# Patient Record
Sex: Male | Born: 1995
Health system: Southern US, Community
[De-identification: ages and names within clinical notes are randomized; demographics above are authoritative.]

## PROBLEM LIST (undated history)

## (undated) DIAGNOSIS — Z789 Other specified health status: Secondary | ICD-10-CM

## (undated) DIAGNOSIS — N451 Epididymitis: Secondary | ICD-10-CM

---
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
## 2017-10-30 ENCOUNTER — Other Ambulatory Visit: Payer: Self-pay

## 2017-10-30 ENCOUNTER — Ambulatory Visit
Admission: EM | Admit: 2017-10-30 | Discharge: 2017-10-30 | Disposition: A | Discharge status: Home / Self Care | Payer: Self-pay | Attending: Family Medicine | Admitting: Family Medicine

## 2017-10-30 ENCOUNTER — Encounter: Payer: Self-pay | Admitting: Emergency Medicine

## 2017-10-30 DIAGNOSIS — K921 Melena: Secondary | ICD-10-CM

## 2017-10-30 LAB — CBC WITH DIFFERENTIAL/PLATELET
Abs Immature Granulocytes: 0.02 10*3/uL (ref 0.00–0.07)
Basophils Absolute: 0.1 10*3/uL (ref 0.0–0.1)
Basophils Relative: 1 %
EOS ABS: 0.2 10*3/uL (ref 0.0–0.5)
EOS PCT: 4 %
HCT: 46.5 % (ref 39.0–52.0)
HEMOGLOBIN: 15.9 g/dL (ref 13.0–17.0)
Immature Granulocytes: 0 %
LYMPHS ABS: 2 10*3/uL (ref 0.7–4.0)
LYMPHS PCT: 37 %
MCH: 30.9 pg (ref 26.0–34.0)
MCHC: 34.2 g/dL (ref 30.0–36.0)
MCV: 90.3 fL (ref 80.0–100.0)
MONO ABS: 0.3 10*3/uL (ref 0.1–1.0)
MONOS PCT: 6 %
Neutro Abs: 2.8 10*3/uL (ref 1.7–7.7)
Neutrophils Relative %: 52 %
Platelets: 242 10*3/uL (ref 150–400)
RBC: 5.15 MIL/uL (ref 4.22–5.81)
RDW: 12.5 % (ref 11.5–15.5)
WBC: 5.3 10*3/uL (ref 4.0–10.5)
nRBC: 0 % (ref 0.0–0.2)

## 2017-10-30 LAB — OCCULT BLOOD X 1 CARD TO LAB, STOOL: Fecal Occult Bld: NEGATIVE

## 2017-10-30 NOTE — Discharge Instructions (Addendum)
Continue to monitor and monitor for trigger.   You need to follow up with Gastroenterology. See above to call today to schedule.   Return to Urgent care as needed.

## 2017-10-30 NOTE — ED Triage Notes (Signed)
Patient c/o intermittent blood in his stools that started over 1 year ago. Denies any pain.

## 2017-10-30 NOTE — ED Provider Notes (Signed)
[name] ____________________________________________  Time seen: Approximately 11:06 AM  I have reviewed the triage vital signs and the nursing notes.   HISTORY  Chief Complaint Blood In Stools  HPI Russell Atkinson is a 22 y.o. male presenting for evaluation of bloody stools that have been intermittent for at least 1 year.  States he usually noticed this and it will prove present for 2 to 3 days and then fully resolve.  Varies for frequency from days to weeks in between.  States he is beginning to question if he has a lactose intolerance, as he believes he noticed this after having a lot of milk.  States 3 days ago he had a lot of milk and cereal, and the next day he had diarrhea when he noticed some blood in the stool and in the toilet that was bright red.  States the diarrhea stool appeared brown and not black.  Some blood present on the tissue with wiping.  States this stopped yesterday, and he did have a bowel movement this morning that was described as normal without any abnormal colored stool or blood noted.  Denies any accompanying abdominal pain, vomiting, constipation, straining, trauma, increase physical activity.  Denies any other abnormal bleeding.  Continues to eat and drink well.  Has had some weight gain in the last few years, no acute weight loss.  Continues to eat normally.  Denies any other aggravating alleviating factors.  States overall he feels well.  Not had this previously evaluated.    History reviewed. No pertinent past medical history. Denies   There are no active problems to display for this patient.   History reviewed. No pertinent surgical history.   No current facility-administered medications for this encounter.  No current outpatient medications on file.  Allergies Patient has no known allergies.   family history Mother: healthy Father: healthy   Denies family history GI issues.   Social History Social History    Tobacco Use  . Smoking status: Never Smoker  . Smokeless tobacco: Never Used  Substance Use Topics  . Alcohol use: Yes  . Drug use: Yes    Types: Marijuana    Comment: yesterday    Review of Systems Constitutional: No fever/chills Cardiovascular: Denies chest pain. Respiratory: Denies shortness of breath. Gastrointestinal: No abdominal pain.  No nausea, no vomiting.  As above.  Genitourinary: Negative for dysuria.  Denies penile or testicular pain or discomfort. Musculoskeletal: Negative for back pain. Skin: Negative for rash.   ____________________________________________   PHYSICAL EXAM:  VITAL SIGNS: ED Triage Vitals  Enc Vitals Group     BP 10/30/17 0950 136/71     Pulse Rate 10/30/17 0950 (!) 57     Resp 10/30/17 0950 18     Temp 10/30/17 0950 98.5 F (36.9 C)     Temp Source 10/30/17 0950 Oral     SpO2 10/30/17 0950 97 %     Weight 10/30/17 0947 218 lb (98.9 kg)     Height 10/30/17 0947 5\' 10"  (1.778 m)     Head Circumference --      Peak Flow --      Pain Score 10/30/17 0947 0     Pain Loc --      Pain Edu? --      Excl. in GC? --     Constitutional: Alert and oriented. Well appearing and in no acute distress. ENT      Head: Normocephalic and atraumatic. Cardiovascular: Normal rate, regular rhythm. Grossly  normal heart sounds.  Good peripheral circulation. Respiratory: Normal respiratory effort without tachypnea nor retractions. Breath sounds are clear and equal bilaterally. No wheezes, rales, rhonchi. Gastrointestinal: Soft and nontender. No distention. Normal Bowel sounds. No CVA tenderness. Rectal: Patient declined chaperone. External: No external mass, bulge or hemorrhoid noted.  Digital exam mildly tender, no hemorrhoid palpated, no gross blood, brown stool. Musculoskeletal:  Steady gait.  Neurologic:  Normal speech and language. Speech is normal. No gait instability.  Skin:  Skin is warm, dry and intact. No rash noted. Psychiatric: Mood and  affect are normal. Speech and behavior are normal. Patient exhibits appropriate insight and judgment   ___________________________________________   LABS (all labs ordered are listed, but only abnormal results are displayed)  Labs Reviewed  CBC WITH DIFFERENTIAL/PLATELET  OCCULT BLOOD X 1 CARD TO LAB, STOOL   ____________________________________________ PROCEDURES Procedures    INITIAL IMPRESSION / ASSESSMENT AND PLAN / ED COURSE  Pertinent labs & imaging results that were available during my care of the patient were reviewed by me and considered in my medical decision making (see chart for details).  Well-appearing patient.  No acute distress.  Report of intermittent rectal bleeding and blood in stool for the last 1 year.  Denies current symptoms.  Denies any pain or current discomfort.  CBC unremarkable.  Hemoccult exam negative for blood at this time.  Patient does report concern of possible food trigger, counseled regarding regarding monitoring for triggers.  Encourage patient to have follow-up with GI in the next 1 weeks, information given and directed patient to call today and schedule.  Patient agrees with this plan.  Also encouraged to establish primary care.  Discussed follow up and return parameters including no resolution or any worsening concerns. Patient verbalized understanding and agreed to plan.   ____________________________________________   FINAL CLINICAL IMPRESSION(S) / ED DIAGNOSES  Final diagnoses:  Blood in stool     ED Discharge Orders    None       Note: This dictation was prepared with Dragon dictation along with smaller phrase technology. Any transcriptional errors that result from this process are unintentional.         Renford Dills, NP 10/30/17 1401

## 2018-06-24 ENCOUNTER — Encounter: Payer: Self-pay | Admitting: Emergency Medicine

## 2018-06-24 ENCOUNTER — Other Ambulatory Visit: Payer: Self-pay

## 2018-06-24 ENCOUNTER — Ambulatory Visit
Admission: EM | Admit: 2018-06-24 | Discharge: 2018-06-24 | Disposition: A | Discharge status: Home / Self Care | Payer: Self-pay | Attending: Family Medicine | Admitting: Family Medicine

## 2018-06-24 DIAGNOSIS — L255 Unspecified contact dermatitis due to plants, except food: Secondary | ICD-10-CM

## 2018-06-24 MED ORDER — CLOBETASOL PROPIONATE 0.05 % EX CREA: 1.0000 "application " | TOPICAL_CREAM | Freq: Two times a day (BID) | CUTANEOUS | 0 refills | Status: DC

## 2018-06-24 NOTE — ED Provider Notes (Signed)
[name]    CSN: [id number] Arrival date & time: 06/24/18  1657  History   Chief Complaint Chief Complaint  Patient presents with  . Rash    HPI   23 year old male presents with rash.  Patient reports a 3-day history of itchy rash.  He has an area located on the posterior aspect of his neck.  He also has a few areas located on his upper extremities.  No medications or interventions tried.  Patient states that the appearance seems to be consistent with poison oak or poison ivy.  No known exacerbating factors.  He is unsure of his exposure.  No other associated symptoms.  No other complaints.  Hx reviewed and updated as below. PMH: Hx of rectal bleeding.  Home Medications    Prior to Admission medications   Medication Sig Start Date End Date Taking? Authorizing Provider  clobetasol cream (TEMOVATE) 0.05 % Apply 1 application topically 2 (two) times daily. 06/24/18   Tommie Samsook, Emerick Weatherly G, DO   Social History Social History   Tobacco Use  . Smoking status: Never Smoker  . Smokeless tobacco: Never Used  Substance Use Topics  . Alcohol use: Yes  . Drug use: Yes    Types: Marijuana    Comment: yesterday     Allergies   Patient has no known allergies.   Review of Systems Review of Systems  Constitutional: Negative.   Skin: Positive for rash.   Physical Exam Triage Vital Signs ED Triage Vitals [06/24/18 1713]  Enc Vitals Group     BP 130/66     Pulse Rate 67     Resp 18     Temp 98.3 F (36.8 C)     Temp Source Oral     SpO2 100 %     Weight 195 lb (88.5 kg)     Height 5\' 10"  (1.778 m)     Head Circumference      Peak Flow      Pain Score 0     Pain Loc      Pain Edu?      Excl. in GC?    Updated Vital Signs BP 130/66 (BP Location: Right Arm)   Pulse 67   Temp 98.3 F (36.8 C) (Oral)   Resp 18   Ht 5\' 10"  (1.778 m)   Wt 88.5 kg   SpO2 100%   BMI 27.98 kg/m   Visual Acuity Right Eye Distance:   Left Eye Distance:   Bilateral Distance:     Right Eye Near:   Left Eye Near:    Bilateral Near:     Physical Exam Vitals signs and nursing note reviewed.  Constitutional:      General: He is not in acute distress.    Appearance: Normal appearance.  HENT:     Head: Normocephalic and atraumatic.  Eyes:     General:        Right eye: No discharge.        Left eye: No discharge.     Conjunctiva/sclera: Conjunctivae normal.  Neck:      Comments: Erythematous, vesicular rash noted at the labelled location. Pulmonary:     Effort: Pulmonary effort is normal. No respiratory distress.  Skin:    Comments: Arms with a few scattered erythematous papules.  Neurological:     Mental Status: He is alert.  Psychiatric:        Mood and Affect: Mood normal.  Behavior: Behavior normal.    UC Treatments / Results  Labs (all labs ordered are listed, but only abnormal results are displayed) Labs Reviewed - No data to display  EKG None  Radiology No results found.  Procedures Procedures (including critical care time)  Medications Ordered in UC Medications - No data to display  Initial Impression / Assessment and Plan / UC Course  I have reviewed the triage vital signs and the nursing notes.  Pertinent labs & imaging results that were available during my care of the patient were reviewed by me and considered in my medical decision making (see chart for details).    23 year old male presents with rash that is likely secondary to poison oak/ivy/sumac. Treating with clobetasol.  Final Clinical Impressions(s) / UC Diagnoses   Final diagnoses:  Dermatitis due to plants, including poison ivy, sumac, and oak     Discharge Instructions     Medication as prescribed.  Take care  Dr. Lacinda Axon    ED Prescriptions    Medication Sig Dispense Auth. Provider   clobetasol cream (TEMOVATE) 1.70 % Apply 1 application topically 2 (two) times daily. 60 g Coral Spikes, DO     Controlled Substance Prescriptions [place]  Controlled Substance Registry consulted? Not Applicable   Coral Spikes, DO 06/24/18 1753

## 2018-06-24 NOTE — ED Triage Notes (Signed)
Patient c/o itchy rash that started 3 days ago. He thinks he may have come in contact with poison ivy. He reports it on the back of his neck and bilateral arms.

## 2018-06-24 NOTE — Discharge Instructions (Signed)
Medication as prescribed.  Take care  Dr. Tishana Clinkenbeard  

## 2018-09-02 ENCOUNTER — Other Ambulatory Visit: Payer: Self-pay

## 2018-09-02 DIAGNOSIS — Z20822 Contact with and (suspected) exposure to covid-19: Secondary | ICD-10-CM

## 2018-09-03 LAB — NOVEL CORONAVIRUS, NAA: SARS-CoV-2, NAA: NOT DETECTED

## 2019-03-22 ENCOUNTER — Other Ambulatory Visit: Payer: Self-pay

## 2019-03-22 ENCOUNTER — Emergency Department
Admission: EM | Admit: 2019-03-22 | Discharge: 2019-03-22 | Disposition: A | Discharge status: Home / Self Care | Payer: Self-pay | Attending: Emergency Medicine | Admitting: Emergency Medicine

## 2019-03-22 DIAGNOSIS — Y929 Unspecified place or not applicable: Secondary | ICD-10-CM | POA: Insufficient documentation

## 2019-03-22 DIAGNOSIS — S3121XA Laceration without foreign body of penis, initial encounter: Secondary | ICD-10-CM | POA: Insufficient documentation

## 2019-03-22 DIAGNOSIS — Z79899 Other long term (current) drug therapy: Secondary | ICD-10-CM | POA: Insufficient documentation

## 2019-03-22 DIAGNOSIS — Y9389 Activity, other specified: Secondary | ICD-10-CM | POA: Insufficient documentation

## 2019-03-22 DIAGNOSIS — X58XXXA Exposure to other specified factors, initial encounter: Secondary | ICD-10-CM | POA: Insufficient documentation

## 2019-03-22 DIAGNOSIS — Y999 Unspecified external cause status: Secondary | ICD-10-CM | POA: Insufficient documentation

## 2019-03-22 DIAGNOSIS — F17200 Nicotine dependence, unspecified, uncomplicated: Secondary | ICD-10-CM | POA: Insufficient documentation

## 2019-03-22 NOTE — ED Provider Notes (Signed)
-----------------------------------------   9:14 PM on 03/22/2019 -----------------------------------------  I have personally seen and evaluated the patient.  Patient has a small tear to the frenulum attached to the foreskin.  No urethral injury identified.  No penile tenderness besides the injured frenulum.  Patient will be discharged home discussed wound care with Neosporin and avoiding intercourse for the next 10 days.   Minna Antis, MD 03/22/19 2114

## 2019-03-22 NOTE — ED Notes (Signed)
This RN went into pt's treatment room to attain d/c vitals and go over d/c instructions. Pt not found in treatment room or in nearby bathrooms. Provider Annice Pih made aware.

## 2019-03-22 NOTE — ED Triage Notes (Signed)
Pt states he was having intercourse and he felt a tear and noticed penile bleeding from the tip. Pt states he called 911 but when the ambulance arrived the bleeding had stopped.

## 2019-03-22 NOTE — ED Provider Notes (Signed)
Emergency Department Provider Note  ____________________________________________  Time seen: Approximately 8:59 PM  I have reviewed the triage vital signs and the nursing notes.   HISTORY  Chief Complaint Penile Discharge   Historian Patient     HPI Russell Atkinson is a 24 y.o. male presents to the emergency department with penile pain and bleeding from the urethral meatus.  Patient states that he was engaging in intercourse with his significant other when he felt a sharp pain and experienced bleeding.  He denies a history of penile issues in the past.  No recent dysuria, hematuria or increased urinary frequency.   History reviewed. No pertinent past medical history.   Immunizations up to date:  Yes.     History reviewed. No pertinent past medical history.  There are no problems to display for this patient.   History reviewed. No pertinent surgical history.  Prior to Admission medications   Medication Sig Start Date End Date Taking? Authorizing Provider  clobetasol cream (TEMOVATE) 0.05 % Apply 1 application topically 2 (two) times daily. 06/24/18   Russell Sams, DO    Allergies Patient has no known allergies.  History reviewed. No pertinent family history.  Social History Social History   Tobacco Use  . Smoking status: Current Every Day Smoker  . Smokeless tobacco: Never Used  Substance Use Topics  . Alcohol use: Yes  . Drug use: Yes    Types: Marijuana    Comment: yesterday     Review of Systems  Constitutional: No fever/chills Eyes:  No discharge ENT: No upper respiratory complaints. Respiratory: no cough. No SOB/ use of accessory muscles to breath Gastrointestinal:   No nausea, no vomiting.  No diarrhea.  No constipation. Musculoskeletal: Negative for musculoskeletal pain. Skin: Patient has bleeding from frenulum of penis.  ____________________________________________   PHYSICAL EXAM:  VITAL SIGNS: ED Triage Vitals  [03/22/19 1806]  Enc Vitals Group     BP (!) 136/94     Pulse Rate 73     Resp 18     Temp 98.5 F (36.9 C)     Temp Source Oral     SpO2 98 %     Weight 200 lb (90.7 kg)     Height 5\' 10"  (1.778 m)     Head Circumference      Peak Flow      Pain Score 0     Pain Loc      Pain Edu?      Excl. in GC?      Constitutional: Alert and oriented. Well appearing and in no acute distress. Eyes: Conjunctivae are normal. PERRL. EOMI. Head: Atraumatic. Cardiovascular: Normal rate, regular rhythm. Normal S1 and S2.  Good peripheral circulation. Respiratory: Normal respiratory effort without tachypnea or retractions. Lungs CTAB. Good air entry to the bases with no decreased or absent breath sounds Gastrointestinal: Bowel sounds x 4 quadrants. Soft and nontender to palpation. No guarding or rigidity. No distention. Musculoskeletal: Full range of motion to all extremities. No obvious deformities noted Neurologic:  Normal for age. No gross focal neurologic deficits are appreciated.  Skin: Patient is uncircumcised with bleeding at frenulum of penis. Psychiatric: Mood and affect are normal for age. Speech and behavior are normal.   ____________________________________________   LABS (all labs ordered are listed, but only abnormal results are displayed)  Labs Reviewed - No data to display ____________________________________________  EKG   ____________________________________________  RADIOLOGY   No results found.  ____________________________________________    PROCEDURES  Procedure(s) performed:     Procedures     Medications - No data to display   ____________________________________________   INITIAL IMPRESSION / ASSESSMENT AND PLAN / ED COURSE  Pertinent labs & imaging results that were available during my care of the patient were reviewed by me and considered in my medical decision making (see chart for details).      Assessment and plan Frenulum  tear 24 year old male presents to the emergency department with a small tear of frenulum of penis that occurred while patient was engaged in intercourse.  Attending Dr. Kerman Passey was consulted regarding patient's case and we both have low suspicion for urethral injury.  Patient education regarding use of topical antibiotics and wound care was given.  Patient was advised to abstain from sex for the next 7 to 10 days.  He was advised to follow-up with primary care as needed.  All patient questions were answered.  ____________________________________________  FINAL CLINICAL IMPRESSION(S) / ED DIAGNOSES  Final diagnoses:  Laceration of penis, initial encounter      NEW MEDICATIONS STARTED DURING THIS VISIT:  ED Discharge Orders    None          This chart was dictated using voice recognition software/Dragon. Despite best efforts to proofread, errors can occur which can change the meaning. Any change was purely unintentional.     Russell Atkinson 03/25/19 2036    Russell Dark, MD 03/27/19 2234

## 2019-04-15 ENCOUNTER — Ambulatory Visit
Admission: EM | Admit: 2019-04-15 | Discharge: 2019-04-15 | Disposition: A | Discharge status: Home / Self Care | Payer: 59 | Attending: Family Medicine | Admitting: Family Medicine

## 2019-04-15 ENCOUNTER — Other Ambulatory Visit: Payer: Self-pay

## 2019-04-15 ENCOUNTER — Encounter: Payer: Self-pay | Admitting: Emergency Medicine

## 2019-04-15 DIAGNOSIS — Z8619 Personal history of other infectious and parasitic diseases: Secondary | ICD-10-CM

## 2019-04-15 DIAGNOSIS — A549 Gonococcal infection, unspecified: Secondary | ICD-10-CM | POA: Diagnosis not present

## 2019-04-15 DIAGNOSIS — R300 Dysuria: Secondary | ICD-10-CM

## 2019-04-15 HISTORY — DX: Personal history of other infectious and parasitic diseases: Z86.19

## 2019-04-15 HISTORY — DX: Other specified health status: Z78.9

## 2019-04-15 LAB — URINALYSIS, COMPLETE (UACMP) WITH MICROSCOPIC
Bacteria, UA: NONE SEEN
Bilirubin Urine: NEGATIVE
Glucose, UA: NEGATIVE mg/dL
Hgb urine dipstick: NEGATIVE
Ketones, ur: NEGATIVE mg/dL
Nitrite: NEGATIVE
Protein, ur: NEGATIVE mg/dL
RBC / HPF: NONE SEEN RBC/hpf (ref 0–5)
Specific Gravity, Urine: 1.02 (ref 1.005–1.030)
Squamous Epithelial / HPF: NONE SEEN (ref 0–5)
pH: 7 (ref 5.0–8.0)

## 2019-04-15 LAB — CHLAMYDIA/NGC RT PCR (ARMC ONLY)
Chlamydia Tr: NOT DETECTED
N gonorrhoeae: DETECTED — AB

## 2019-04-15 MED ORDER — CEFTRIAXONE SODIUM 500 MG IJ SOLR
500.0000 mg | Freq: Once | INTRAMUSCULAR | Status: AC
  Administered 2019-04-15: 500 mg via INTRAMUSCULAR

## 2019-04-15 MED ORDER — DOXYCYCLINE HYCLATE 100 MG PO CAPS: 100.0000 mg | ORAL_CAPSULE | Freq: Two times a day (BID) | ORAL | 0 refills | Status: DC

## 2019-04-15 NOTE — ED Provider Notes (Signed)
[name]    CSN: [id number] Arrival date & time: 04/15/19  1141      History   Chief Complaint Chief Complaint  Patient presents with  . Dysuria    HPI  24 year old male presents with dysuria and penile discharge.  Patient reports his symptoms started yesterday.  He reports that he developed dysuria yesterday.  Describes it as burning when he urinates.  Reports that he had penile discharge this morning.  Patient concerned for STD.  Patient has recently engaged in a 3 some with another male and his current male partner.  Reports that his pain is currently 3/10 in severity.  No other associated symptoms.  No other complaints.   Home Medications    Prior to Admission medications   Medication Sig Start Date End Date Taking? Authorizing Provider  doxycycline (VIBRAMYCIN) 100 MG capsule Take 1 capsule (100 mg total) by mouth 2 (two) times daily. 04/15/19   Tommie Sams, DO   Social History Social History   Tobacco Use  . Smoking status: Current Every Day Smoker  . Smokeless tobacco: Never Used  Substance Use Topics  . Alcohol use: Yes  . Drug use: Yes    Types: Marijuana    Comment: yesterday     Allergies   Patient has no known allergies.   Review of Systems Review of Systems  Constitutional: Negative.   Genitourinary: Positive for discharge and dysuria.   Physical Exam Triage Vital Signs ED Triage Vitals  Enc Vitals Group     BP 04/15/19 1211 140/69     Pulse Rate 04/15/19 1211 71     Resp 04/15/19 1211 18     Temp 04/15/19 1211 98.2 F (36.8 C)     Temp Source 04/15/19 1211 Oral     SpO2 04/15/19 1211 100 %     Weight 04/15/19 1209 215 lb (97.5 kg)     Height 04/15/19 1209 5\' 10"  (1.778 m)     Head Circumference --      Peak Flow --      Pain Score 04/15/19 1208 3     Pain Loc --      Pain Edu? --      Excl. in GC? --    Updated Vital Signs BP 140/69 (BP Location: Right Arm)   Pulse 71   Temp 98.2 F (36.8 C) (Oral)   Resp  18   Ht 5\' 10"  (1.778 m)   Wt 97.5 kg   SpO2 100%   BMI 30.85 kg/m   Visual Acuity Right Eye Distance:   Left Eye Distance:   Bilateral Distance:    Right Eye Near:   Left Eye Near:    Bilateral Near:     Physical Exam Vitals and nursing note reviewed.  Constitutional:      General: He is not in acute distress.    Appearance: Normal appearance. He is not ill-appearing.  HENT:     Head: Normocephalic and atraumatic.  Eyes:     General:        Right eye: No discharge.        Left eye: No discharge.     Conjunctiva/sclera: Conjunctivae normal.  Pulmonary:     Effort: Pulmonary effort is normal. No respiratory distress.  Genitourinary:    Comments: Uncircumcised penis.  Foreskin easily retracted.  No apparent penile discharge at this time. Neurological:     Mental Status: He is alert.  Psychiatric:  Mood and Affect: Mood normal.        Behavior: Behavior normal.    UC Treatments / Results  Labs (all labs ordered are listed, but only abnormal results are displayed) Labs Reviewed  CHLAMYDIA/NGC RT PCR ([facility] ONLY) - Abnormal; Notable for the following components:      Result Value   N gonorrhoeae DETECTED (*)    All other components within normal limits  URINALYSIS, COMPLETE (UACMP) WITH MICROSCOPIC - Abnormal; Notable for the following components:   Leukocytes,Ua TRACE (*)    All other components within normal limits    EKG   Radiology No results found.  Procedures Procedures (including critical care time)  Medications Ordered in UC Medications  cefTRIAXone (ROCEPHIN) injection 500 mg (500 mg Intramuscular Given 04/15/19 1234)    Initial Impression / Assessment and Plan / UC Course  I have reviewed the triage vital signs and the nursing notes.  Pertinent labs & imaging results that were available during my care of the patient were reviewed by me and considered in my medical decision making (see chart for details).    24 year old male  presents with dysuria and penile discharge.  Test result revealed gonorrhea.  Patient was given empiric Rocephin and doxycycline.  Final Clinical Impressions(s) / UC Diagnoses   Final diagnoses:  Gonorrhea     Discharge Instructions     Medication as prescribed. Take with food. Avoid the sun.  Results should be available tomorrow.  Take care  Dr. Lacinda Axon     ED Prescriptions    Medication Sig Dispense Auth. Provider   doxycycline (VIBRAMYCIN) 100 MG capsule Take 1 capsule (100 mg total) by mouth 2 (two) times daily. 14 capsule Thersa Salt G, DO     PDMP not reviewed this encounter.   Coral Spikes, [place] 04/15/19 1922

## 2019-04-15 NOTE — Discharge Instructions (Signed)
Medication as prescribed. Take with food. Avoid the sun.  Results should be available tomorrow.  Take care  Dr. Adriana Simas

## 2019-04-15 NOTE — ED Triage Notes (Signed)
Patient c/o penile pain that started hurting yesterday. He does c/o dysuria that started yesterday. He is also concerned for STDs.

## 2019-04-16 ENCOUNTER — Encounter: Payer: Self-pay | Admitting: Emergency Medicine

## 2019-04-16 ENCOUNTER — Ambulatory Visit (INDEPENDENT_AMBULATORY_CARE_PROVIDER_SITE_OTHER): Payer: 59

## 2019-04-16 ENCOUNTER — Ambulatory Visit
Admission: EM | Admit: 2019-04-16 | Discharge: 2019-04-16 | Disposition: A | Discharge status: Home / Self Care | Payer: 59 | Attending: Urgent Care | Admitting: Urgent Care

## 2019-04-16 ENCOUNTER — Other Ambulatory Visit: Payer: Self-pay

## 2019-04-16 DIAGNOSIS — N50811 Right testicular pain: Secondary | ICD-10-CM

## 2019-04-16 DIAGNOSIS — A549 Gonococcal infection, unspecified: Secondary | ICD-10-CM

## 2019-04-16 DIAGNOSIS — N50819 Testicular pain, unspecified: Secondary | ICD-10-CM

## 2019-04-16 HISTORY — DX: Epididymitis: N45.1

## 2019-04-16 MED ORDER — KETOROLAC TROMETHAMINE 60 MG/2ML IM SOLN
60.0000 mg | Freq: Once | INTRAMUSCULAR | Status: AC
  Administered 2019-04-16: 12:00:00 60 mg via INTRAMUSCULAR

## 2019-04-16 NOTE — Discharge Instructions (Addendum)
It was very nice seeing you today in clinic. Thank you for entrusting me with your care.   [place] was NEGATIVE for torsion and epididymitis.   Make sure you have informed all sexual partners so that they may be tested and treated as needed. Abstain from sexual activity for the next 7-10 days.   You indicated that you did not want anything stronger for pain. Recommend Tylenol 500 mg and Ibuprofen 400 mg taken TOGETHER for pain.   Make arrangements to follow up with your regular doctor in 1 week for re-evaluation if not improving. If your symptoms/condition worsens, please seek follow up care either here or in the ER. Please remember, our [facility] providers are "right here with you" when you need [place].   Again, it was my pleasure to take care of you today. Thank you for choosing our clinic. I hope that you start to feel better quickly.   Quentin Mulling, MSN, APRN, FNP-C, CEN Advanced Practice Provider [name]

## 2019-04-16 NOTE — ED Triage Notes (Signed)
Patient c/o right testicle pain that started this morning. Patient has a history of epididymitis.

## 2019-04-16 NOTE — ED Provider Notes (Signed)
Russell Atkinson, Russell Atkinson   Name: Russell Atkinson DOB: 1995/10/20 MRN: [id number] CSN: [id number] PCP: Patient, No Pcp Per  Arrival date and time:  04/16/19 1055  Chief Complaint:  Testicle Pain  NOTE: Prior to seeing the patient today, I have reviewed the triage nursing documentation and vital signs. Clinical staff has updated patient's PMH/PSHx, current medication list, and drug allergies/intolerances to ensure comprehensive history available to assist in medical decision making.   History:   HPI: Russell Atkinson is a 24 y.o. male who presents today with complaints of pain and swelling in his RIGHT testicle that started with acute onset earlier this morning. Patient was seen here yesterday by Russell Atkinson for complaints of dysuria and penile discharge following participation in a multi-partner sexual encounter. STI testing resulted (+) for gonorrhea. He was empirically treated in clinic with ceftriaxone 500 mg IM and sent home on a 7 day course of oral doxycycline. PMH (+) for epididymitis per his report. Patient states, "this feels the same as it did last time. I went to the doctor and they told me it was making my testicle twist and I could have lost it. I want to make sure that it not what is going on this time to be safe". Patient continues with the dysuria and penile discharge. Patient denies fever, chills, or other systemic signs of infection; no hypotension, tachycardia, nausea, vomiting, or vertiginous symptoms. Patient is not experiencing any abdominal pain. Despite his symptoms, patient has not taken any over the counter interventions to help improve/relieve his reported symptoms at home.   Past Medical History:  Diagnosis Date  . Epididymitis   . History of gonorrhea 04/15/2019  . Uncircumcised male     History reviewed. No pertinent surgical history.  History reviewed. No pertinent family history.  Social History   Tobacco Use  . Smoking status: Current Every Day  Smoker  . Smokeless tobacco: Never Used  Substance Use Topics  . Alcohol use: Yes  . Drug use: Yes    Types: Marijuana    Comment: yesterday    There are no problems to display for this patient.   Home Medications:    Current Meds  Medication Sig  . doxycycline (VIBRAMYCIN) 100 MG capsule Take 1 capsule (100 mg total) by mouth 2 (two) times daily.    Allergies:   Patient has no known allergies.  Review of Systems (ROS):  Review of systems NEGATIVE unless otherwise noted in narrative H&P section.   Vital Signs: Today's Vitals   04/16/19 1110 04/16/19 1111 04/16/19 1112 04/16/19 1221  BP:   120/80   Pulse:   64   Resp:   18   Temp:   98.2 F (36.8 C)   TempSrc:   Oral   SpO2:   98%   Weight:  214 lb 15.2 oz (97.5 kg)    Height:  5\' 10"  (1.778 m)    PainSc: 8    8     Physical Exam: Physical Exam  Constitutional: He is oriented to person, place, and time and well-developed, well-nourished, and in no distress.  HENT:  Head: Normocephalic and atraumatic.  Eyes: Pupils are equal, round, and reactive to light.  Cardiovascular: Normal rate and intact distal pulses.  Pulmonary/Chest: Effort normal. No respiratory distress.  Abdominal: Soft. Normal appearance and bowel sounds are normal. He exhibits no distension. There is no abdominal tenderness. There is no CVA tenderness.  Genitourinary: He exhibits testicular tenderness (RIGHT) and scrotal tenderness. He exhibits  no epididymal tenderness. Cremasteric reflex is present. Penis exhibits no lesions and no edema. No discharge found.    Genitourinary Comments: Phren's (-). Tested (+) for gonorrhea on 04/15/2019.   Neurological: He is alert and oriented to person, place, and time. Gait normal.  Skin: Skin is warm and dry. No rash noted. He is not diaphoretic.  Psychiatric: Mood, memory, affect and judgment normal.  Nursing note and vitals reviewed.   Urgent Care Treatments / Results:   Orders Placed This Encounter   Procedures  . US SCROTUM W/DOPPLER    LABS: PLEASE NOTE: all labs that were ordered this encounter are listed, however only abnormal results are displayed. Labs Reviewed - No data to display  EKG: -None  RADIOLOGY: US SCROTUM W/DOPPLER  Result Date: 04/16/2019 CLINICAL DATA:  Right greater than left testicular pain since this morning. EXAM: SCROTAL ULTRASOUND DOPPLER ULTRASOUND OF THE TESTICLES TECHNIQUE: Complete ultrasound examination of the testicles, epididymis, and other scrotal structures was performed. Color and spectral Doppler ultrasound were also utilized to evaluate blood flow to the testicles. COMPARISON:  None. FINDINGS: Right testicle Measurements: 4.3 x 2.6 x 3.1 cm. No mass or microlithiasis visualized. Left testicle Measurements: 3.7 x 2.4 x 3.1 cm. No mass or microlithiasis visualized. Right epididymis:  Normal in size and appearance. Left epididymis:  Normal in size and appearance. Hydrocele:  Tiny amount right scrotal fluid. Varicocele:  None visualized. Pulsed Doppler interrogation of both testes demonstrates normal low resistance arterial and venous waveforms bilaterally. IMPRESSION: 1. Normal symmetric testicles with normal symmetric vascularity. No acute findings and no evidence of torsion. Electronically Signed   By: Russell Atkinson M.D.   On: 04/16/2019 12:14    PROCEDURES: Procedures  MEDICATIONS RECEIVED THIS VISIT: Medications  ketorolac (TORADOL) injection 60 mg (60 mg Intramuscular Given 04/16/19 1205)    PERTINENT CLINICAL COURSE NOTES/UPDATES:   Initial Impression / Assessment and Plan / Urgent Care Course:  Pertinent labs & imaging results that were available during my care of the patient were personally reviewed by me and considered in my medical decision making (see lab/imaging section of note for values and interpretations).  Russell Atkinson is a 24 y.o. male who presents to [facility] today with complaints of Testicle Pain   Patient is well appearing overall in clinic today. He does not appear to be in any acute distress. Presenting symptoms (see HPI) and exam as documented above. Patient with acute onset RIGHT testicular tenderness. PMH (+) for epididymitis and patient is concerned about recurrence. Dicussed other possible causes of his pain, with the main concern being torsion. Phren's sign is negative, however RIGHT testicle is tender. Patient wishes to proceed with US imaging to further assessment. In efforts to make [place] study more tolerable in the setting of patient with acute pain, he was given ketorolac 60 mg IM injection prior to imaging study.    US of the scrotum with doppler performed. Study provided no evidence of testicular torsion or acute epididymitis. Testicles normal BILATERALLY.   Results reviewed with patient. Reassurance provided. Discussed that symptoms are related to his current gonococcal infection. He was treated yesterday with ceftriaxone injection and discharged home on empiric doxycycline for chlamydial coverage. Chlamydia testing was negative. Patient advised that he could stop the doxycycline. Recommended supportive care at home. Patient to use heat/ice as tolerated for his groin symptoms. He does not want to have any type of medications that will sedate him or make him groggy, thus we will defer any  prescriptions today. May use Tylenol and/or Ibuprofen as needed for pain. Patient reminded to make all sexual partners aware of gonorrhea diagnosis so that they can be evaluated and treated for personal and public health purposes.   Discussed follow up with primary care physician in 1 week for re-evaluation if not improving. I have reviewed the follow up and strict return precautions for any new or worsening symptoms. Patient is aware of symptoms that would be deemed urgent/emergent, and would thus require further evaluation either here or in the emergency department. At the time of discharge, he  verbalized understanding and consent with the discharge plan as it was reviewed with him. All questions were fielded by provider and/or clinic staff prior to patient discharge.    Final Clinical Impressions / Urgent Care Diagnoses:   Final diagnoses:  Testicular pain, right  Gonorrhea    New Prescriptions:  [place] Controlled Substance Registry consulted? Not Applicable  Meds ordered this encounter  Medications  . ketorolac (TORADOL) injection 60 mg    Recommended Follow up Care:  Patient encouraged to follow up with the following provider within the specified time frame, or sooner as dictated by the severity of his symptoms. As always, he was instructed that for any urgent/emergent care needs, he should seek care either here or in the emergency department for more immediate evaluation.  Follow-up Information    PCP In 1 week.   Why: General reassessment of symptoms if not improving        NOTE: This note was prepared using Scientist, clinical (histocompatibility and immunogenetics) along with smaller Lobbyist. Despite my best ability to proofread, there is the potential that transcriptional errors may still occur from this process, and are completely unintentional.    Verlee Monte, NP 04/17/19 1257

## 2019-08-12 ENCOUNTER — Emergency Department: Payer: 59

## 2019-08-12 ENCOUNTER — Other Ambulatory Visit: Payer: Self-pay

## 2019-08-12 ENCOUNTER — Emergency Department
Admission: EM | Admit: 2019-08-12 | Discharge: 2019-08-12 | Disposition: A | Discharge status: Home / Self Care | Payer: 59 | Attending: Emergency Medicine | Admitting: Emergency Medicine

## 2019-08-12 ENCOUNTER — Encounter: Payer: Self-pay | Admitting: Emergency Medicine

## 2019-08-12 DIAGNOSIS — Z5321 Procedure and treatment not carried out due to patient leaving prior to being seen by health care provider: Secondary | ICD-10-CM | POA: Insufficient documentation

## 2019-08-12 DIAGNOSIS — R0602 Shortness of breath: Secondary | ICD-10-CM | POA: Diagnosis not present

## 2019-08-12 DIAGNOSIS — R079 Chest pain, unspecified: Secondary | ICD-10-CM | POA: Diagnosis present

## 2019-08-12 LAB — CBC
HCT: 41.4 % (ref 39.0–52.0)
Hemoglobin: 14.8 g/dL (ref 13.0–17.0)
MCH: 31.8 pg (ref 26.0–34.0)
MCHC: 35.7 g/dL (ref 30.0–36.0)
MCV: 89 fL (ref 80.0–100.0)
Platelets: 233 10*3/uL (ref 150–400)
RBC: 4.65 MIL/uL (ref 4.22–5.81)
RDW: 12.9 % (ref 11.5–15.5)
WBC: 7.9 10*3/uL (ref 4.0–10.5)
nRBC: 0 % (ref 0.0–0.2)

## 2019-08-12 LAB — BASIC METABOLIC PANEL
Anion gap: 10 (ref 5–15)
BUN: 17 mg/dL (ref 6–20)
CO2: 26 mmol/L (ref 22–32)
Calcium: 9.3 mg/dL (ref 8.9–10.3)
Chloride: 103 mmol/L (ref 98–111)
Creatinine, Ser: 0.64 mg/dL (ref 0.61–1.24)
GFR calc Af Amer: >60 mL/min (ref >60)
GFR calc non Af Amer: >60 mL/min (ref >60)
Glucose, Bld: 94 mg/dL (ref 70–99)
Potassium: 3.8 mmol/L (ref 3.5–5.1)
Sodium: 139 mmol/L (ref 135–145)

## 2019-08-12 LAB — TROPONIN I (HIGH SENSITIVITY): Troponin I (High Sensitivity): <2 ng/L (ref <18)

## 2019-08-12 NOTE — ED Triage Notes (Signed)
Pt here for chest pain to mid chest that is intermittent X 1 week.  Also having SHOB. Unlabored, NAD.  No fever or cough.

## 2019-08-13 ENCOUNTER — Ambulatory Visit
Admission: EM | Admit: 2019-08-13 | Discharge: 2019-08-13 | Disposition: A | Discharge status: Home / Self Care | Payer: 59 | Attending: Family Medicine | Admitting: Family Medicine

## 2019-08-13 ENCOUNTER — Encounter: Payer: Self-pay | Admitting: Emergency Medicine

## 2019-08-13 DIAGNOSIS — K219 Gastro-esophageal reflux disease without esophagitis: Secondary | ICD-10-CM

## 2019-08-13 DIAGNOSIS — R079 Chest pain, unspecified: Secondary | ICD-10-CM | POA: Diagnosis not present

## 2019-08-13 MED ORDER — PANTOPRAZOLE SODIUM 40 MG PO TBEC: 40.0000 mg | DELAYED_RELEASE_TABLET | Freq: Every day | ORAL | 0 refills | Status: AC

## 2019-08-13 NOTE — ED Triage Notes (Signed)
Patient c/o chest pain that started 3 days ago. He went to the ER last night but left without being seen. He states he had blood work and an EKG completed last night.

## 2019-08-15 NOTE — ED Provider Notes (Signed)
[name]    CSN: [id number] Arrival date & time: 08/13/19  1444  History   Chief Complaint Chief Complaint  Patient presents with  . Chest Pain   HPI  24 year old male presents with the above complaint.  Patient reports 3-day history of chest pain.  Located centrally.  He has been to the ER as of last night left without being seen due to long wait time.  Patient reports continued and constant pain.  He notes that he has taking Tums frequently due to heartburn.  He is in no pain at this time.  He states that his pain has resolved since being here.  No shortness of breath.  No diaphoresis.  No other associated symptoms.  Past Medical History:  Diagnosis Date  . Epididymitis   . History of gonorrhea 04/15/2019  . Uncircumcised male    Home Medications    Prior to Admission medications   Medication Sig Start Date End Date Taking? Authorizing Provider  pantoprazole (PROTONIX) 40 MG tablet Take 1 tablet (40 mg total) by mouth daily. 08/13/19   Tommie Sams, DO    Family History History reviewed. No pertinent family history.  Social History Social History   Tobacco Use  . Smoking status: Current Every Day Smoker  . Smokeless tobacco: Never Used  Vaping Use  . Vaping Use: Never used  Substance Use Topics  . Alcohol use: Yes  . Drug use: Yes    Types: Marijuana    Comment: yesterday     Allergies   Patient has no known allergies.   Review of Systems Review of Systems  Constitutional: Negative.   Cardiovascular: Positive for chest pain.   Physical Exam Triage Vital Signs ED Triage Vitals  Enc Vitals Group     BP 08/13/19 1613 (!) 124/93     Pulse Rate 08/13/19 1613 67     Resp 08/13/19 1613 18     Temp 08/13/19 1613 98.3 F (36.8 C)     Temp Source 08/13/19 1613 Oral     SpO2 08/13/19 1613 100 %     Weight 08/13/19 1614 220 lb (99.8 kg)     Height 08/13/19 1614 5\' 11"  (1.803 m)     Head Circumference --      Peak Flow --      Pain Score  08/13/19 1614 0     Pain Loc --      Pain Edu? --      Excl. in GC? --    Updated Vital Signs BP (!) 124/93 (BP Location: Right Arm)   Pulse 67   Temp 98.3 F (36.8 C) (Oral)   Resp 18   Ht 5\' 11"  (1.803 m)   Wt 99.8 kg   SpO2 100%   BMI 30.68 kg/m   Visual Acuity Right Eye Distance:   Left Eye Distance:   Bilateral Distance:    Right Eye Near:   Left Eye Near:    Bilateral Near:     Physical Exam Vitals and nursing note reviewed.  Constitutional:      General: He is not in acute distress.    Appearance: He is well-developed. He is not ill-appearing.  HENT:     Head: Normocephalic and atraumatic.  Eyes:     General:        Right eye: No discharge.        Left eye: No discharge.     Conjunctiva/sclera: Conjunctivae normal.  Cardiovascular:     Rate  and Rhythm: Normal rate and regular rhythm.  Pulmonary:     Effort: Pulmonary effort is normal.     Breath sounds: Normal breath sounds.  Chest:     Chest wall: No tenderness.  Neurological:     Mental Status: He is alert.  Psychiatric:        Mood and Affect: Mood normal.        Behavior: Behavior normal.     UC Treatments / Results  Labs (all labs ordered are listed, but only abnormal results are displayed) Labs Reviewed - No data to display  EKG   Radiology No results found.  Procedures Procedures (including critical care time)  Medications Ordered in UC Medications - No data to display  Initial Impression / Assessment and Plan / UC Course  I have reviewed the triage vital signs and the nursing notes.  Pertinent labs & imaging results that were available during my care of the patient were reviewed by me and considered in my medical decision making (see chart for details).    24 year old male presents with chest pain.  Has had recent work-up in the ER although he did not stay.  EKG normal.  Labs normal.  Patient has been taking a lot of Tums due to heartburn.  I believe that his chest pain is  coming from GERD.  Placing on Protonix.  Final Clinical Impressions(s) / UC Diagnoses   Final diagnoses:  Gastroesophageal reflux disease without esophagitis  Chest pain, unspecified type   Discharge Instructions   None    ED Prescriptions    Medication Sig Dispense Auth. Provider   pantoprazole (PROTONIX) 40 MG tablet Take 1 tablet (40 mg total) by mouth daily. 30 tablet Tommie Sams, DO     PDMP not reviewed this encounter.   Tommie Sams, [place] 08/15/19 1219

## 2020-05-06 ENCOUNTER — Other Ambulatory Visit: Payer: Self-pay

## 2020-05-06 ENCOUNTER — Encounter: Payer: Self-pay | Admitting: Emergency Medicine

## 2020-05-06 ENCOUNTER — Ambulatory Visit
Admission: EM | Admit: 2020-05-06 | Discharge: 2020-05-06 | Disposition: A | Discharge status: Home / Self Care | Payer: BC Managed Care – PPO | Attending: Emergency Medicine | Admitting: Emergency Medicine

## 2020-05-06 DIAGNOSIS — L247 Irritant contact dermatitis due to plants, except food: Secondary | ICD-10-CM

## 2020-05-06 MED ORDER — PREDNISONE 20 MG PO TABS: 60.0000 mg | ORAL_TABLET | Freq: Every day | ORAL | 0 refills | Status: AC

## 2020-05-06 NOTE — ED Provider Notes (Signed)
[name]    CSN: [id number] Arrival date & time: 05/06/20  1438      History   Chief Complaint Chief Complaint  Patient presents with  . Rash    HPI Tajh Livsey is a 25 y.o. male.   HPI   Ration of rash on both forearms.  Patient reports that he developed a rash 5 days ago in his forearms after he mowed his lawn and uses weed [employer].  He reports that a lot of the brush and debris got all over his forearms and his lower legs and he has had some red raised areas that are itchy ever since.  Patient denies any respiratory complaints, swelling his lips, tongue, or trouble swallowing.  Past Medical History:  Diagnosis Date  . Epididymitis   . History of gonorrhea 04/15/2019  . Uncircumcised male     There are no problems to display for this patient.   History reviewed. No pertinent surgical history.     Home Medications    Prior to Admission medications   Medication Sig Start Date End Date Taking? Authorizing Provider  predniSONE (DELTASONE) 20 MG tablet Take 3 tablets (60 mg total) by mouth daily with breakfast for 5 days. 3 tablets by mouth daily for 3 days, 2 tablets by mouth daily for 4 days, and 1 tablet by mouth daily for 5 days. 05/06/20 05/11/20 Yes Becky Augusta, NP  pantoprazole (PROTONIX) 40 MG tablet Take 1 tablet (40 mg total) by mouth daily. 08/13/19   Tommie Sams, DO    Family History History reviewed. No pertinent family history.  Social History Social History   Tobacco Use  . Smoking status: Former Games developer  . Smokeless tobacco: Never Used  Vaping Use  . Vaping Use: Never used  Substance Use Topics  . Alcohol use: Yes  . Drug use: Yes    Types: Marijuana    Comment: yesterday     Allergies   Patient has no known allergies.   Review of Systems Review of Systems  Constitutional: Negative for activity change and appetite change.  Respiratory: Negative for shortness of breath and wheezing.   Skin: Positive for  rash.     Physical Exam Triage Vital Signs ED Triage Vitals  Enc Vitals Group     BP 05/06/20 1503 123/65     Pulse Rate 05/06/20 1503 60     Resp 05/06/20 1503 16     Temp 05/06/20 1503 98.1 F (36.7 C)     Temp Source 05/06/20 1503 Oral     SpO2 05/06/20 1503 100 %     Weight 05/06/20 1459 210 lb (95.3 kg)     Height 05/06/20 1459 5\' 10"  (1.778 m)     Head Circumference --      Peak Flow --      Pain Score --      Pain Loc --      Pain Edu? --      Excl. in GC? --    No data found.  Updated Vital Signs BP 123/65 (BP Location: Left Arm)   Pulse 60   Temp 98.1 F (36.7 C) (Oral)   Resp 16   Ht 5\' 10"  (1.778 m)   Wt 210 lb (95.3 kg)   SpO2 100%   BMI 30.13 kg/m   Visual Acuity Right Eye Distance:   Left Eye Distance:   Bilateral Distance:    Right Eye Near:   Left Eye Near:  Bilateral Near:     Physical Exam Vitals and nursing note reviewed.  Constitutional:      General: He is not in acute distress.    Appearance: Normal appearance.  HENT:     Head: Normocephalic and atraumatic.  Skin:    General: Skin is warm.     Capillary Refill: Capillary refill takes less than 2 seconds.     Findings: Rash present.  Neurological:     General: No focal deficit present.     Mental Status: He is alert and oriented to person, place, and time.  Psychiatric:        Mood and Affect: Mood normal.        Behavior: Behavior normal.        Thought Content: Thought content normal.        Judgment: Judgment normal.      UC Treatments / Results  Labs (all labs ordered are listed, but only abnormal results are displayed) Labs Reviewed - No data to display  EKG   Radiology No results found.  Procedures Procedures (including critical care time)  Medications Ordered in UC Medications - No data to display  Initial Impression / Assessment and Plan / UC Course  I have reviewed the triage vital signs and the nursing notes.  Pertinent labs & imaging results  that were available during my care of the patient were reviewed by me and considered in my medical decision making (see chart for details).   Patient is a very pleasant, nontoxic-appearing 25 year old male here for evaluation of an itchy red rash on both forearms has been present for last 5 days after working in his yard.  The rash is scattered around both forearms with itchy red bumps.  There are no pustules, patches, or vesicles present.  There is no surrounding erythema to the skin where the bumps present.  Patient's exam is consistent with contact dermatitis we will treat with calamine lotion, prednisone, Allegra, and Benadryl.   Final Clinical Impressions(s) / UC Diagnoses   Final diagnoses:  Irritant contact dermatitis due to plants, except food     Discharge Instructions     Starting tomorrow morning take the prednisone 60 mg once daily with breakfast for itching and rash.  Take over-the-counter Allegra 180 mg during the day as needed for itching and use Benadryl 50 mg at night as needed for itching and sleep.  Cover the rash with calamine lotion to act as a drying agent and prevent spread.  Return for reevaluation for any new or worsening symptoms.    ED Prescriptions    Medication Sig Dispense Auth. Provider   predniSONE (DELTASONE) 20 MG tablet Take 3 tablets (60 mg total) by mouth daily with breakfast for 5 days. 3 tablets by mouth daily for 3 days, 2 tablets by mouth daily for 4 days, and 1 tablet by mouth daily for 5 days. 15 tablet Becky Augusta, NP     PDMP not reviewed this encounter.   Becky Augusta, NP 05/06/20 1523

## 2020-05-06 NOTE — ED Triage Notes (Signed)
Patient states that he has an itchy rash on his arms that started on Sunday.

## 2020-05-06 NOTE — Discharge Instructions (Addendum)
Starting tomorrow morning take the prednisone 60 mg once daily with breakfast for itching and rash.  Take over-the-counter Allegra 180 mg during the day as needed for itching and use Benadryl 50 mg at night as needed for itching and sleep.  Cover the rash with calamine lotion to act as a drying agent and prevent spread.  Return for reevaluation for any new or worsening symptoms.

## 2020-06-07 IMAGING — US US SCROTUM W/ DOPPLER COMPLETE
1 series · 14 of 25 positions shown · non-contrast
Comparison: None.

CLINICAL DATA: Right greater than left testicular pain since this
morning.

EXAM:
SCROTAL ULTRASOUND
DOPPLER ULTRASOUND OF THE TESTICLES
TECHNIQUE: Complete ultrasound examination of the testicles, epididymis, and
other scrotal structures was performed. Color and spectral Doppler
ultrasound were also utilized to evaluate blood flow to the
testicles.

[Series 1: us scrotum w/ doppler complete · 0.07mm/px · 14 of 46 slices shown]
[im 1/46]
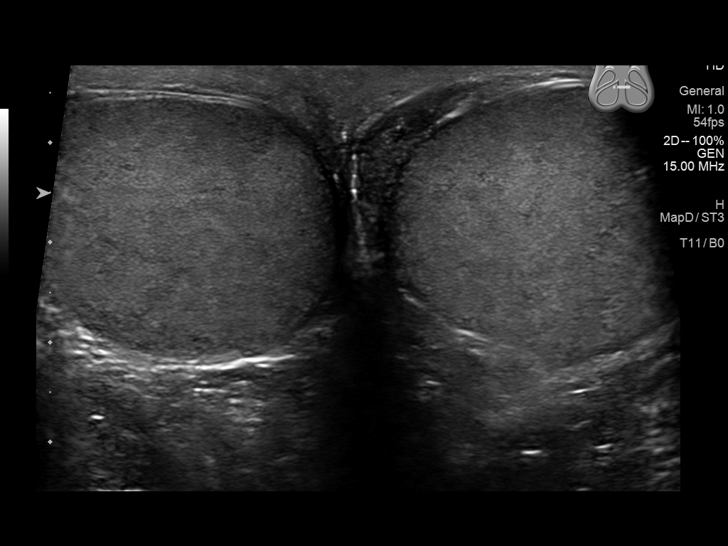
[im 4/46]
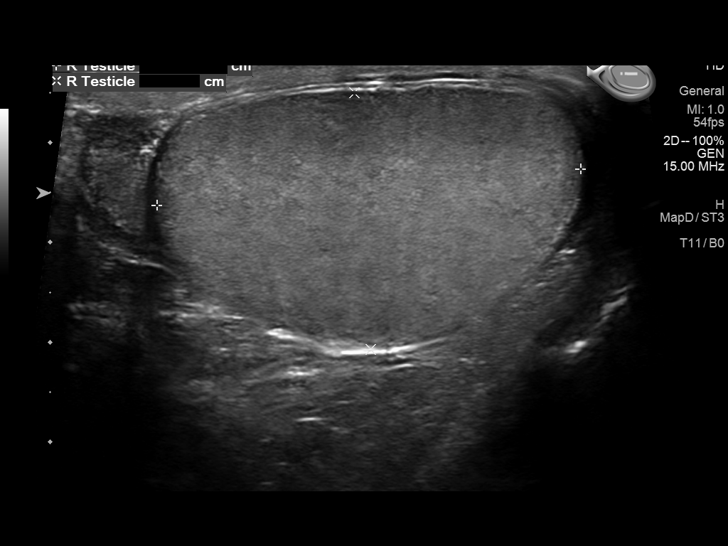
[im 8/46]
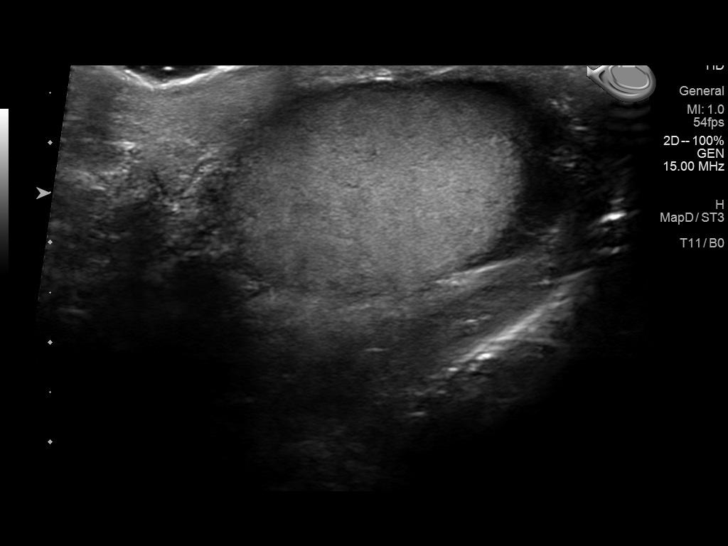
[im 12/46]
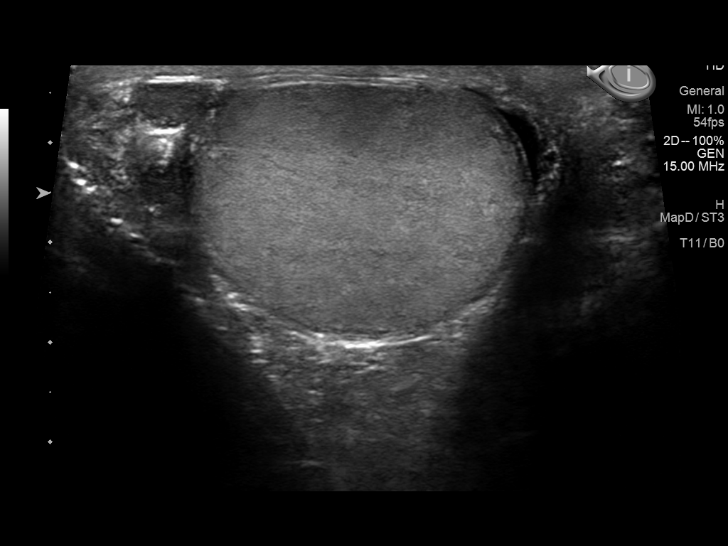
[im 16/46]
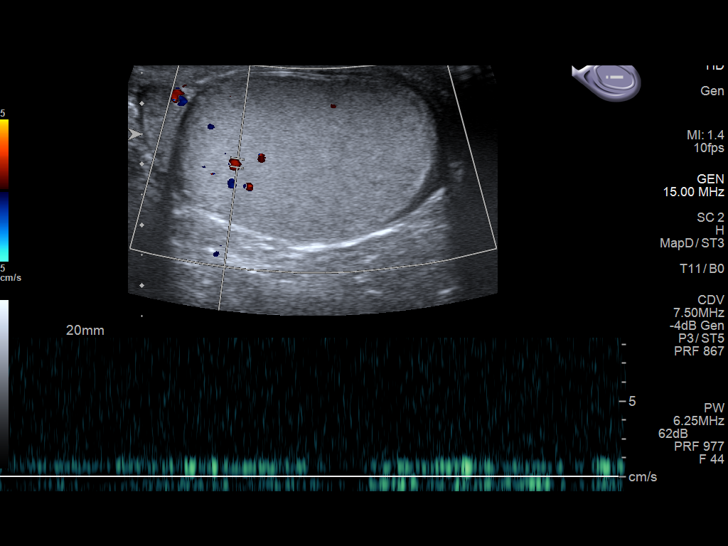
[im 17/46]
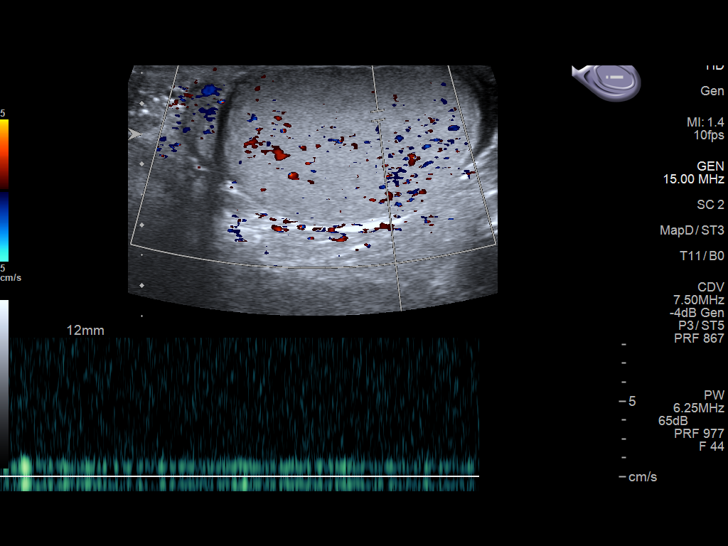
[im 21/46]
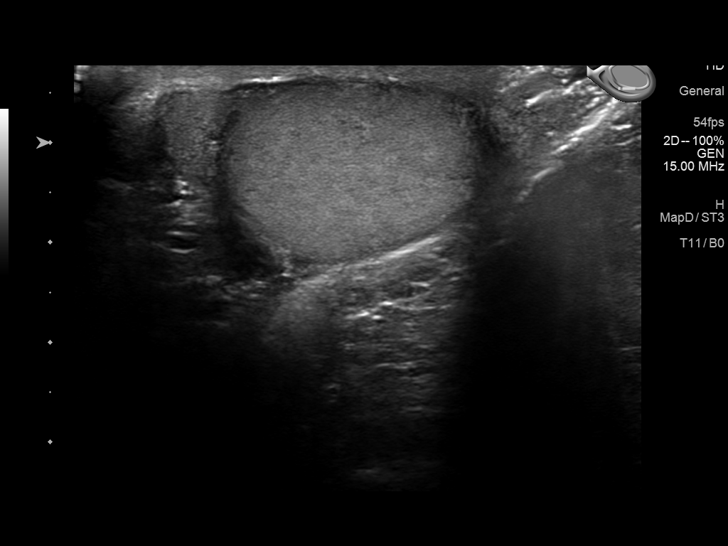
[im 25/46]
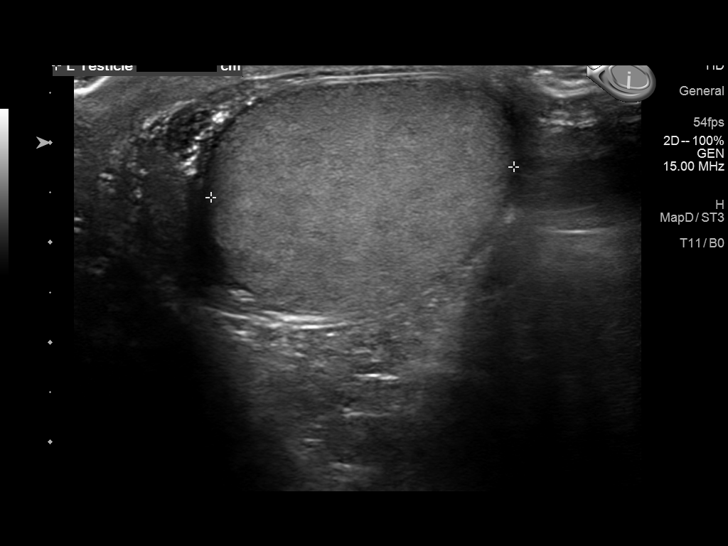
[im 29/46]
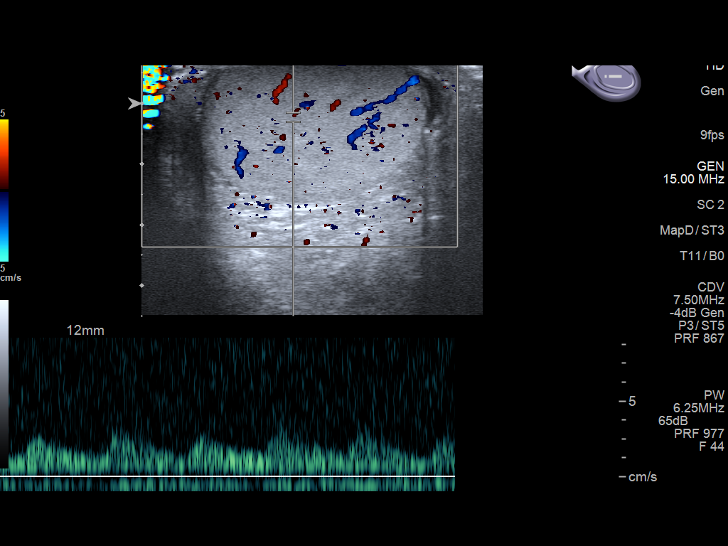
[im 31/46]
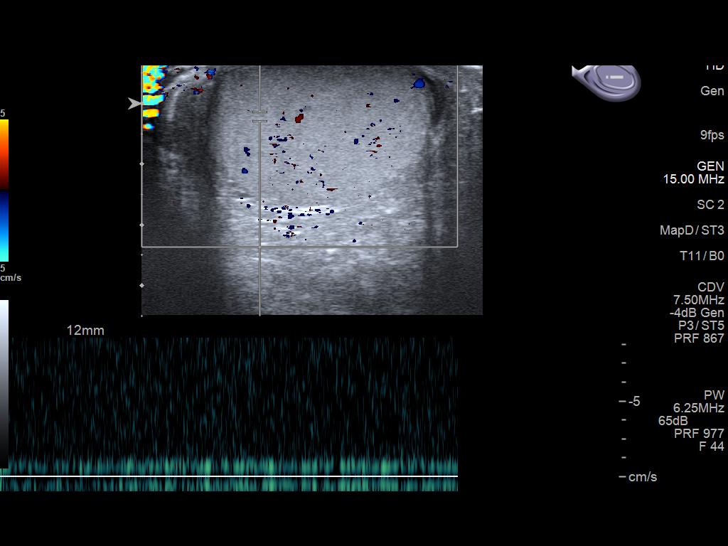
[im 34/46]
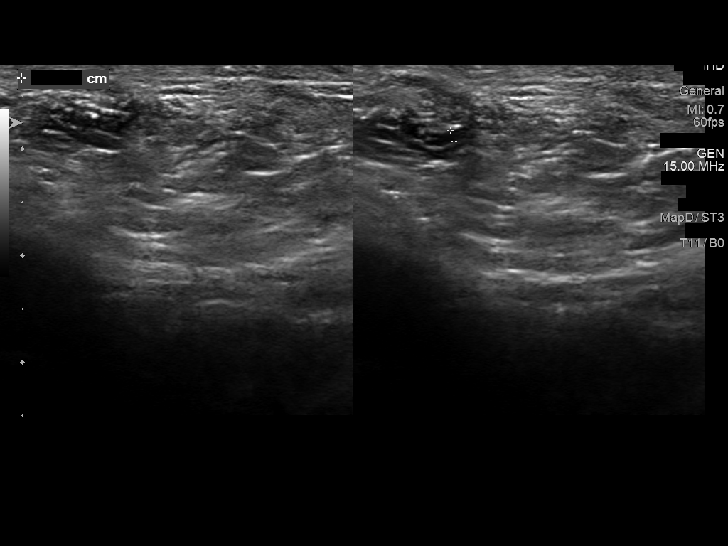
[im 38/46]
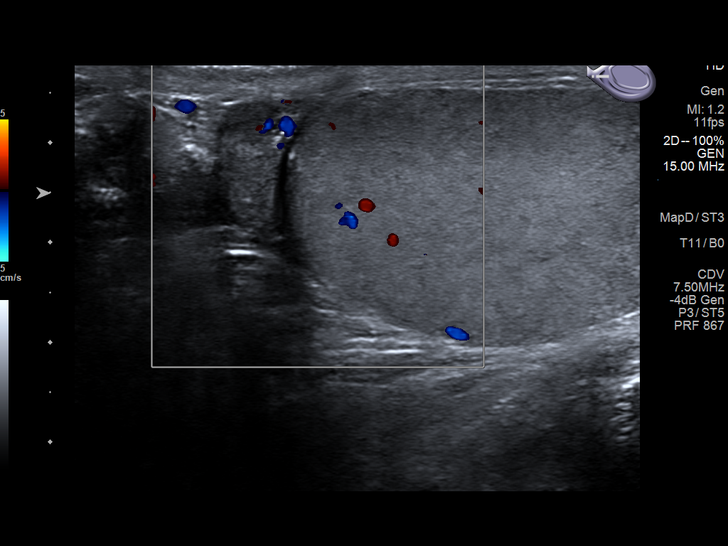
[im 42/46]
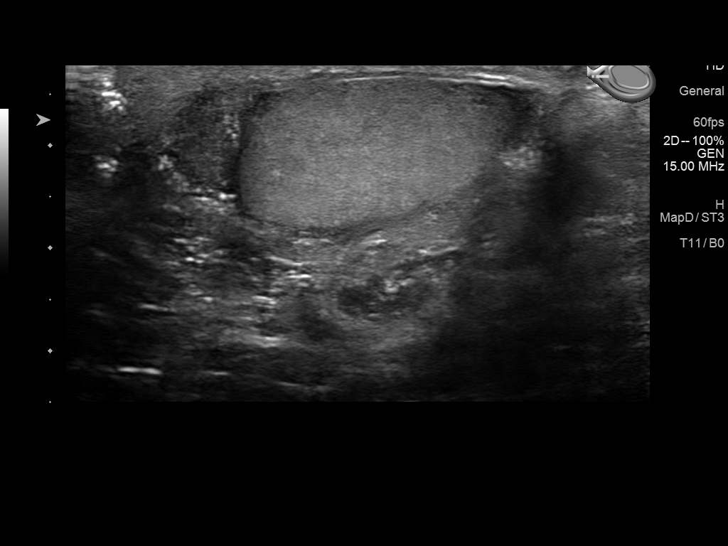
[im 46/46]
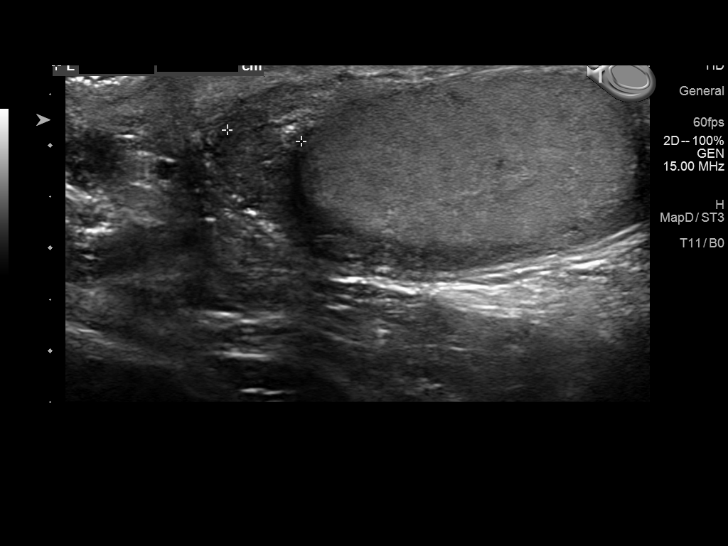

[14 of 25 positions shown; findings below may reference images not displayed]

FINDINGS: Right testicle

Measurements: 4.3 x 2.6 x 3.1 cm. No mass or microlithiasis
visualized.

Left testicle

Measurements: 3.7 x 2.4 x 3.1 cm. No mass or microlithiasis
visualized.

Right epididymis:  Normal in size and appearance.

Left epididymis:  Normal in size and appearance.

Hydrocele:  Tiny amount right scrotal fluid.

Varicocele:  None visualized.

Pulsed Doppler interrogation of both testes demonstrates normal low
resistance arterial and venous waveforms bilaterally.
IMPRESSION: 1. Normal symmetric testicles with normal symmetric vascularity. No
acute findings and no evidence of torsion.

## 2020-10-03 IMAGING — CR DG CHEST 2V
1 series · 2 of 2 positions shown · non-contrast
Comparison: None.

CLINICAL DATA: Chest pain

EXAM:
CHEST - 2 VIEW

[Series 1: dg chest 2 view · 0.14mm/px · 2 of 2 slices shown]
[im 1/2]
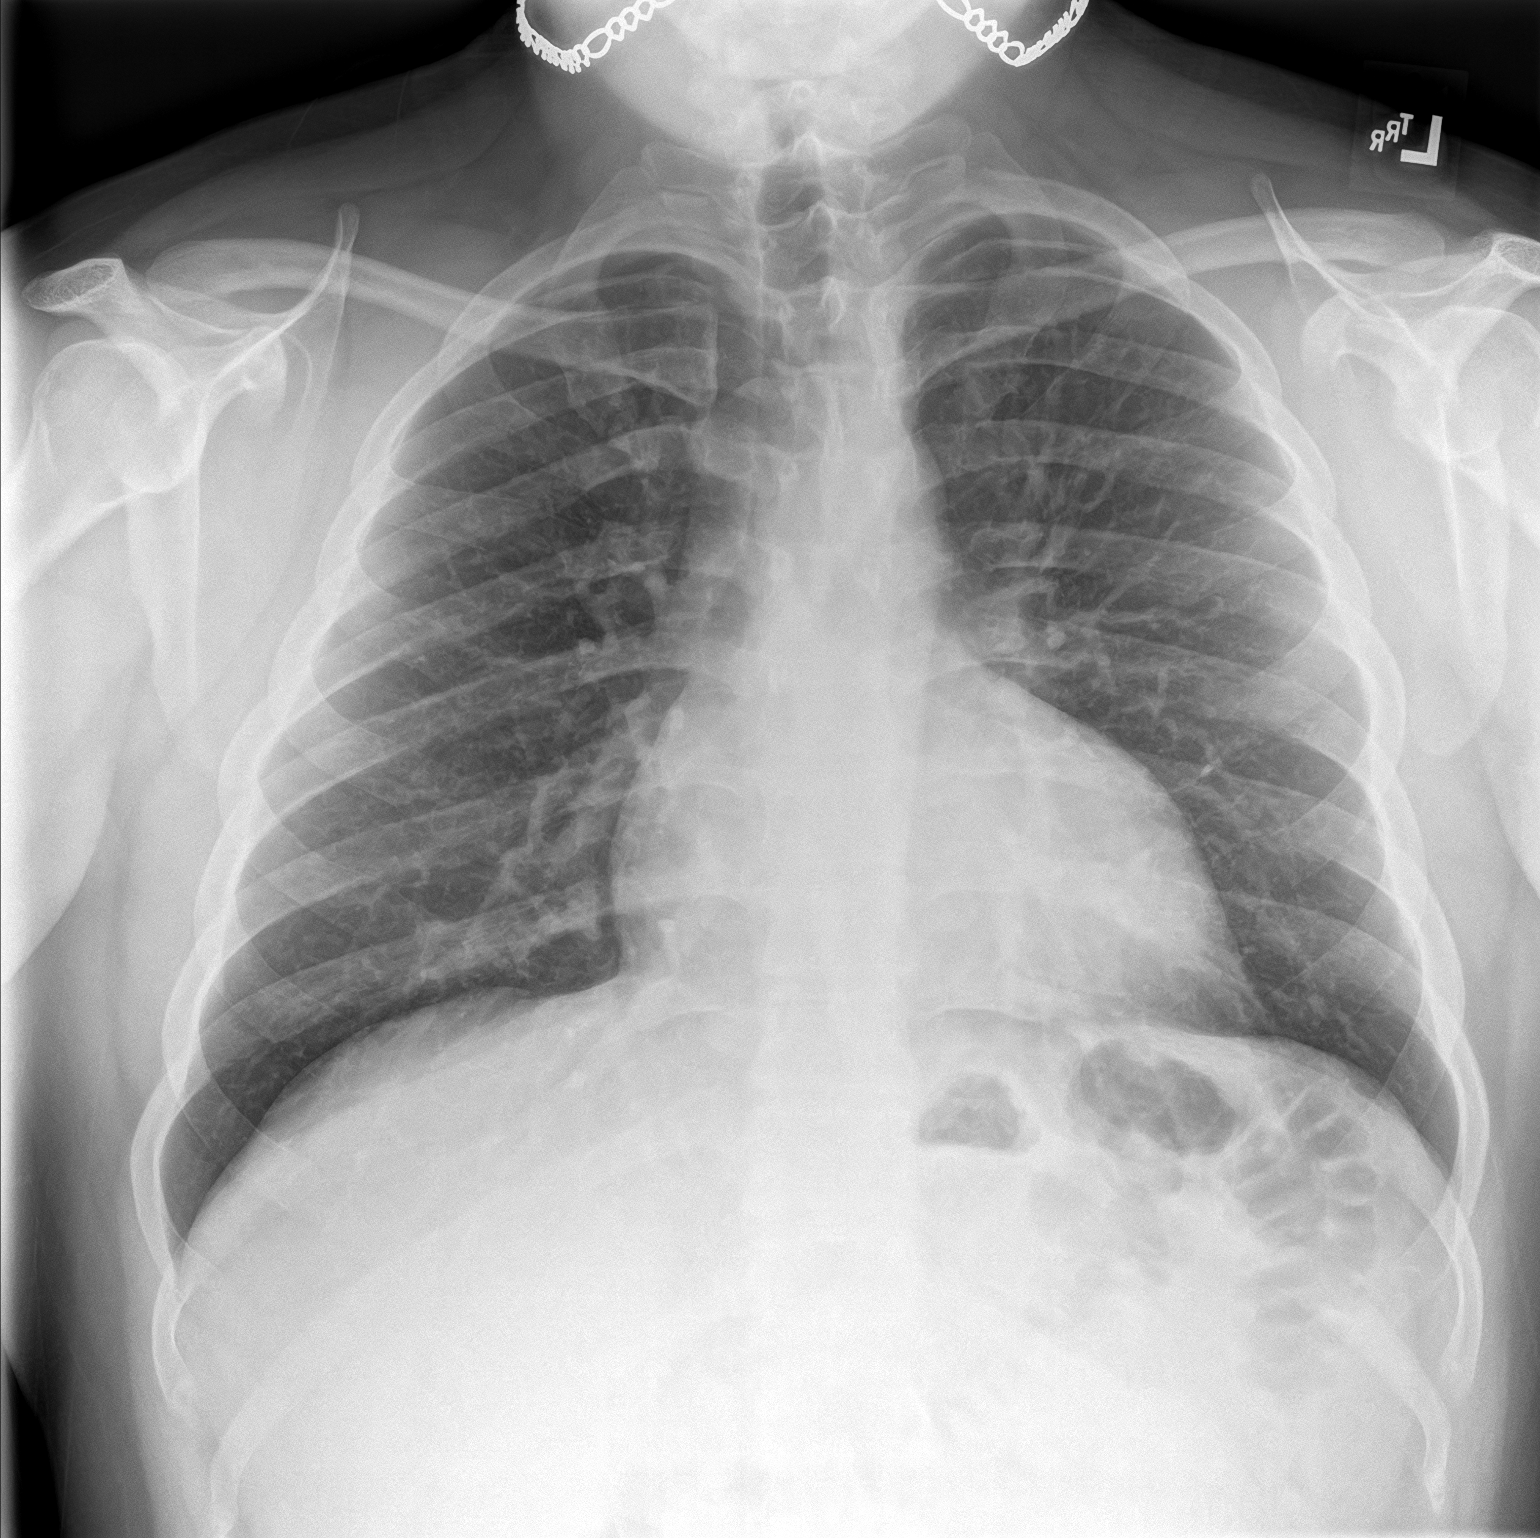
[im 2/2]
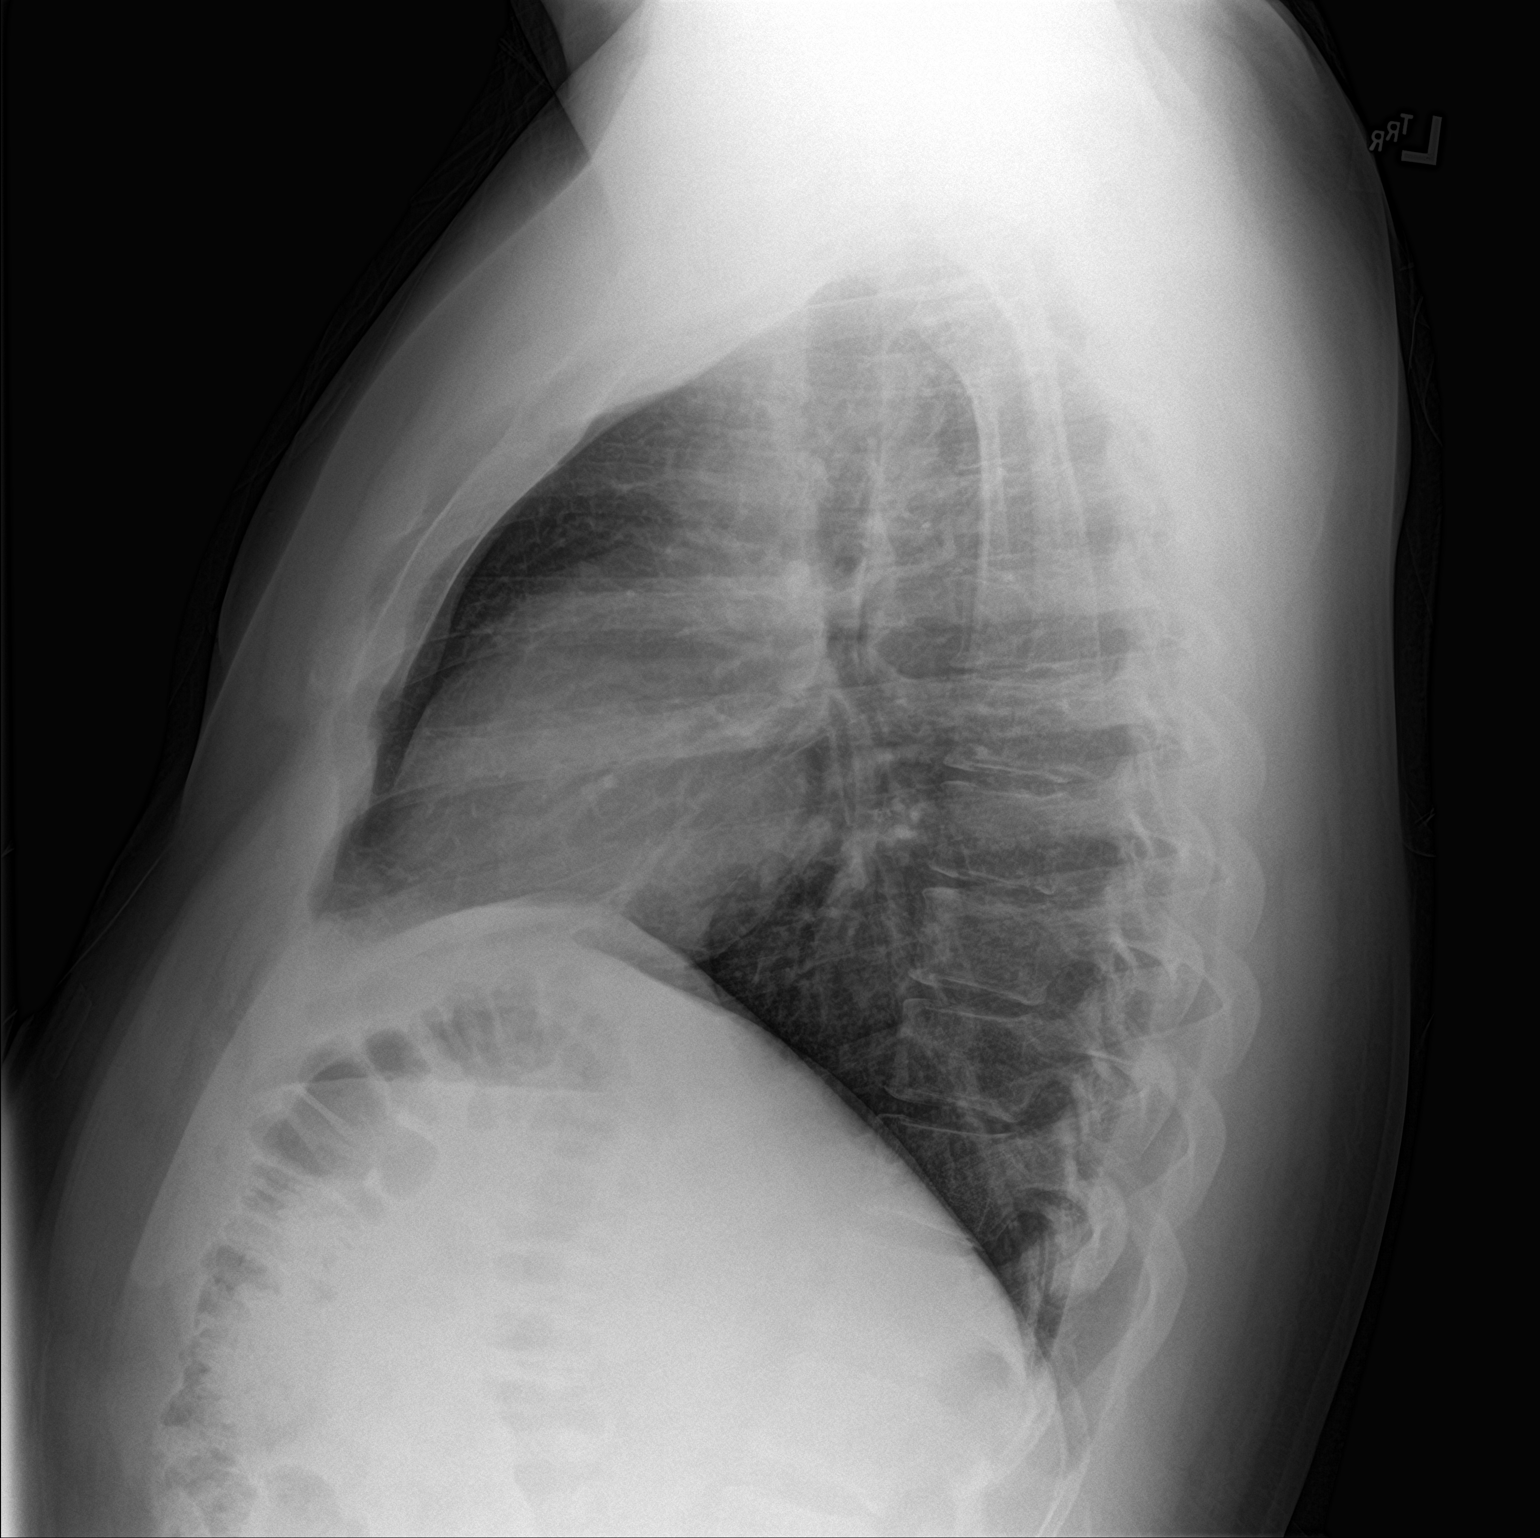

[2 of 2 positions shown; findings below may reference images not displayed]

FINDINGS: The heart size and mediastinal contours are within normal limits.
Both lungs are clear. The visualized skeletal structures are
unremarkable.
IMPRESSION: No active cardiopulmonary disease.

## 2021-01-07 ENCOUNTER — Ambulatory Visit
Admission: EM | Admit: 2021-01-07 | Discharge: 2021-01-07 | Disposition: A | Discharge status: Home / Self Care | Payer: BC Managed Care – PPO | Attending: Family Medicine | Admitting: Family Medicine

## 2021-01-07 ENCOUNTER — Encounter: Payer: Self-pay | Admitting: Emergency Medicine

## 2021-01-07 DIAGNOSIS — L02818 Cutaneous abscess of other sites: Secondary | ICD-10-CM | POA: Diagnosis not present

## 2021-01-07 DIAGNOSIS — Z113 Encounter for screening for infections with a predominantly sexual mode of transmission: Secondary | ICD-10-CM | POA: Insufficient documentation

## 2021-01-07 DIAGNOSIS — L089 Local infection of the skin and subcutaneous tissue, unspecified: Secondary | ICD-10-CM | POA: Insufficient documentation

## 2021-01-07 MED ORDER — DOXYCYCLINE HYCLATE 100 MG PO CAPS: 100.0000 mg | ORAL_CAPSULE | Freq: Two times a day (BID) | ORAL | 0 refills | Status: AC

## 2021-01-07 NOTE — Discharge Instructions (Addendum)
Your lab result may take up to 3-5 days to result. Our office will contact you regarding any abnormal lab results. Start antibiotics for treatment of  infected skin abscess that I drained today The abscess may continue to drain today a small amount. You can shower as usual and cover with a bandage.

## 2021-01-07 NOTE — ED Provider Notes (Signed)
[facility]    CSN: [id number] Arrival date & time: 01/07/21  1057      History   Chief Complaint Chief Complaint  Patient presents with   Mass    HPI Russell Atkinson is a 26 y.o. male.   HPI Patient with a history of epididymitis and GC presents today for evaluation of a bump of the mid pubis region. Report shaving with trimmer and thinks he may have cut himself. The bump has been present for 3 days. Bump is non-tender and had not drained. Denies any other symptoms. No known exposure to STI. Past Medical History:  Diagnosis Date   Epididymitis    History of gonorrhea 04/15/2019   Uncircumcised male     There are no problems to display for this patient.   History reviewed. No pertinent surgical history.     Home Medications    Prior to Admission medications   Medication Sig Start Date End Date Taking? Authorizing Provider  doxycycline (VIBRAMYCIN) 100 MG capsule Take 1 capsule (100 mg total) by mouth 2 (two) times daily. 01/07/21  Yes Bing Neighbors, FNP  pantoprazole (PROTONIX) 40 MG tablet Take 1 tablet (40 mg total) by mouth daily. 08/13/19   Tommie Sams, DO    Family History History reviewed. No pertinent family history.  Social History Social History   Tobacco Use   Smoking status: Former   Smokeless tobacco: Never  Building services engineer Use: Every day  Substance Use Topics   Alcohol use: Yes   Drug use: Yes    Types: Marijuana    Comment: yesterday     Allergies   Patient has no known allergies.   Review of Systems Review of Systems Pertinent negatives listed in HPI   Physical Exam Triage Vital Signs ED Triage Vitals  Enc Vitals Group     BP 01/07/21 1122 (!) 150/79     Pulse Rate 01/07/21 1122 74     Resp 01/07/21 1122 18     Temp 01/07/21 1122 98.1 F (36.7 C)     Temp Source 01/07/21 1122 Oral     SpO2 01/07/21 1122 98 %     Weight --      Height --      Head Circumference --      Peak Flow --       Pain Score 01/07/21 1124 0     Pain Loc --      Pain Edu? --      Excl. in [place]? --    No data found.  Updated Vital Signs BP (!) 150/79 (BP Location: Left Arm)    Pulse 74    Temp 98.1 F (36.7 C) (Oral)    Resp 18    SpO2 98%   Visual Acuity Right Eye Distance:   Left Eye Distance:   Bilateral Distance:    Right Eye Near:   Left Eye Near:    Bilateral Near:     Physical Exam Constitutional:      Appearance: Normal appearance.  Cardiovascular:     Rate and Rhythm: Normal rate and regular rhythm.  Pulmonary:     Effort: Pulmonary effort is normal.     Breath sounds: Normal breath sounds.  Genitourinary:   Skin:    Capillary Refill: Capillary refill takes less than 2 seconds.  Neurological:     Mental Status: He is alert.     UC Treatments / Results  Labs (all labs ordered  are listed, but only abnormal results are displayed) Labs Reviewed  AEROBIC CULTURE W GRAM STAIN (SUPERFICIAL SPECIMEN)  RPR  CYTOLOGY, (ORAL, ANAL, URETHRAL) ANCILLARY ONLY    EKG   Radiology No results found.  Procedures Incision and Drainage  Date/Time: 01/07/2021 12:05 PM Performed by: Bing Neighbors, FNP Authorized by: Bing Neighbors, FNP   Consent:    Consent obtained:  Verbal   Consent given by:  Patient   Risks, benefits, and alternatives were discussed: yes     Risks discussed:  Incomplete drainage   Alternatives discussed:  No treatment Universal protocol:    Patient identity confirmed:  Verbally with patient Location:    Type:  Abscess   Location:  Anogenital   Anogenital location: Mid Pubis region. Pre-procedure details:    Skin preparation:  Povidone-iodine Sedation:    Sedation type:  None Anesthesia:    Anesthesia method:  Local infiltration Procedure type:    Complexity:  Simple Procedure details:    Incision types:  Stab incision   Incision depth:  Dermal   Drainage:  Purulent   Drainage amount:  Moderate   Wound treatment:  Wound left  open Post-procedure details:    Procedure completion:  Tolerated well, no immediate complications Comments:     Dressed with gauze and Tegaderm  (including critical care time)  Medications Ordered in UC Medications - No data to display  Initial Impression / Assessment and Plan / UC Course  I have reviewed the triage vital signs and the nursing notes.  Pertinent labs & imaging results that were available during my care of the patient were reviewed by me and considered in my medical decision making (see chart for details).    Infected skin lesion, cutaneous abscess, and screening for STD Given the appearance of lesion, RPR ordered to rule out syphilis. Patient had an HIV test 3 weeks prior when donating blood products. Cytology to rule out GC/Chlamydia. Wound culture collected given the copious amount of exudate drained from wound. Start Doxycyline for treated of infected abscess. Final diagnoses:  Infected lesion of skin  Screen for STD (sexually transmitted disease)  Cutaneous abscess of other site, genital region     Discharge Instructions      Your lab result may take up to 3-5 days to result. Our office will contact you regarding any abnormal lab results. Start antibiotics for treatment of  infected skin abscess that I drained today The abscess may continue to drain today a small amount. You can shower as usual and cover with a bandage.      ED Prescriptions     Medication Sig Dispense Auth. Provider   doxycycline (VIBRAMYCIN) 100 MG capsule Take 1 capsule (100 mg total) by mouth 2 (two) times daily. 20 capsule Bing Neighbors, FNP      PDMP not reviewed this encounter.   Bing Neighbors, FNP 01/08/21 [phone]

## 2021-01-07 NOTE — ED Triage Notes (Signed)
Pt presents with a bump on the tip of his penis that looks infected x 3 days

## 2021-01-09 LAB — CYTOLOGY, (ORAL, ANAL, URETHRAL) ANCILLARY ONLY
Chlamydia: NEGATIVE
Comment: NEGATIVE
Comment: NEGATIVE
Comment: NORMAL
Neisseria Gonorrhea: NEGATIVE
Trichomonas: NEGATIVE

## 2021-01-10 LAB — RPR: RPR Ser Ql: NONREACTIVE

## 2021-01-10 LAB — AEROBIC CULTURE W GRAM STAIN (SUPERFICIAL SPECIMEN): Culture: NORMAL

## 2021-03-21 ENCOUNTER — Ambulatory Visit
Admission: EM | Admit: 2021-03-21 | Discharge: 2021-03-21 | Disposition: A | Discharge status: Other Acute Inpatient Hospital | Payer: BC Managed Care – PPO

## 2021-03-21 ENCOUNTER — Other Ambulatory Visit: Payer: Self-pay

## 2022-02-24 ENCOUNTER — Emergency Department: Payer: 59

## 2022-02-24 ENCOUNTER — Emergency Department
Admission: EM | Admit: 2022-02-24 | Discharge: 2022-02-24 | Disposition: A | Discharge status: Home / Self Care | Payer: 59 | Attending: Emergency Medicine | Admitting: Emergency Medicine

## 2022-02-24 ENCOUNTER — Other Ambulatory Visit: Payer: Self-pay

## 2022-02-24 DIAGNOSIS — R079 Chest pain, unspecified: Secondary | ICD-10-CM | POA: Diagnosis not present

## 2022-02-24 DIAGNOSIS — I10 Essential (primary) hypertension: Secondary | ICD-10-CM | POA: Diagnosis not present

## 2022-02-24 DIAGNOSIS — Z87891 Personal history of nicotine dependence: Secondary | ICD-10-CM | POA: Diagnosis not present

## 2022-02-24 LAB — CBC
HCT: 43.1 % (ref 39.0–52.0)
Hemoglobin: 14.6 g/dL (ref 13.0–17.0)
MCH: 30.7 pg (ref 26.0–34.0)
MCHC: 33.9 g/dL (ref 30.0–36.0)
MCV: 90.5 fL (ref 80.0–100.0)
Platelets: 230 10*3/uL (ref 150–400)
RBC: 4.76 MIL/uL (ref 4.22–5.81)
RDW: 12.8 % (ref 11.5–15.5)
WBC: 10.2 10*3/uL (ref 4.0–10.5)
nRBC: 0 % (ref 0.0–0.2)

## 2022-02-24 LAB — BASIC METABOLIC PANEL
Anion gap: 9 (ref 5–15)
BUN: 14 mg/dL (ref 6–20)
CO2: 26 mmol/L (ref 22–32)
Calcium: 9.1 mg/dL (ref 8.9–10.3)
Chloride: 102 mmol/L (ref 98–111)
Creatinine, Ser: 0.83 mg/dL (ref 0.61–1.24)
GFR, Estimated: >60 mL/min (ref >60)
Glucose, Bld: 83 mg/dL (ref 70–99)
Potassium: 3.3 mmol/L — ABNORMAL LOW (ref 3.5–5.1)
Sodium: 137 mmol/L (ref 135–145)

## 2022-02-24 LAB — TROPONIN I (HIGH SENSITIVITY): Troponin I (High Sensitivity): <2 ng/L (ref <18)

## 2022-02-24 MED ORDER — IBUPROFEN 400 MG PO TABS
400.0000 mg | ORAL_TABLET | Freq: Once | ORAL | Status: AC
  Administered 2022-02-24: 400 mg via ORAL
  Filled 2022-02-24: qty 1

## 2022-02-24 MED ORDER — ACETAMINOPHEN 500 MG PO TABS
1000.0000 mg | ORAL_TABLET | Freq: Once | ORAL | Status: AC
  Administered 2022-02-24: 1000 mg via ORAL
  Filled 2022-02-24: qty 2

## 2022-02-24 NOTE — ED Triage Notes (Signed)
Pt states started yesterday, goes across his shoulders and a squeezing in his chest.. Also having sob with it.

## 2022-02-24 NOTE — Discharge Instructions (Signed)
Your EKG chest x-ray and blood work are reassuring.  If your pain is not improving please follow-up with primary care.  If you develop worsening pain or increasing shortness of breath please return to the emergency department.  I recommend taking 400 mg of ibuprofen every 6 hours for pain.

## 2022-02-24 NOTE — ED Provider Notes (Signed)
[facility] Provider Note    Event Date/Time   First MD Initiated Contact with Patient 02/24/22 1331     (approximate)   History   Chest Pain   HPI  Russell Atkinson is a 27 y.o. male past medical history of hypertension and tobacco use who presents with chest pain.  Symptoms started last night.  Pain is tight and is in the center of his chest rating to both shoulders.  It is worse with inspiration.  Feels like he is having difficulty breathing because of pain.  Is not clearly positional.  Denies lower extremity pain or swelling denies cough fevers chills.  Denies nausea or diaphoresis.  Denies history of cardiac disease or family history of young people with heart attack.The patient denies hx of prior DVT/PE, unilateral leg pain/swelling, hormone use, recent surgery, hx of cancer, prolonged immobilization, or hemoptysis.       Past Medical History:  Diagnosis Date   Epididymitis    History of gonorrhea 04/15/2019   Uncircumcised male     There are no problems to display for this patient.    Physical Exam  Triage Vital Signs: ED Triage Vitals  Enc Vitals Group     BP --      Pulse --      Resp --      Temp --      Temp src --      SpO2 --      Weight 02/24/22 1242 230 lb (104.3 kg)     Height --      Head Circumference --      Peak Flow --      Pain Score 02/24/22 1241 5     Pain Loc --      Pain Edu? --      Excl. in [place]? --     Most recent vital signs: There were no vitals filed for this visit.   General: Awake, no distress.  Mildly anxious appearing CV:  Good peripheral perfusion.  No signs of DVT, no peripheral edema Resp:  Normal effort.  Lungs are clear Abd:  No distention.  Neuro:             Awake, Alert, Oriented x 3  Other:     ED Results / Procedures / Treatments  Labs (all labs ordered are listed, but only abnormal results are displayed) Labs Reviewed  BASIC METABOLIC PANEL - Abnormal; Notable for the  following components:      Result Value   Potassium 3.3 (*)    All other components within normal limits  CBC  TROPONIN I (HIGH SENSITIVITY)     EKG  I reviewed and interpreted patient's EKG which shows sinus rhythm normal axis normal intervals J-point elevation consistent with early verbalization throughout similar to prior EKG   RADIOLOGY I reviewed and interpreted the CXR which does not show any acute cardiopulmonary process    PROCEDURES:  Critical Care performed: No  .1-3 Lead EKG Interpretation  Performed by: Rada Hay, MD Authorized by: Rada Hay, MD     Interpretation: normal     ECG rate assessment: normal     Rhythm: sinus rhythm     Ectopy: none     Conduction: normal     The patient is on the cardiac monitor to evaluate for evidence of arrhythmia and/or significant heart rate changes.   MEDICATIONS ORDERED IN ED: Medications  ibuprofen (ADVIL) tablet 400 mg (has no  administration in time range)  acetaminophen (TYLENOL) tablet 1,000 mg (has no administration in time range)     IMPRESSION / MDM / ASSESSMENT AND PLAN / ED COURSE  I reviewed the triage vital signs and the nursing notes.                              Patient's presentation is most consistent with acute presentation with potential threat to life or bodily function.  Differential diagnosis includes, but is not limited to, pericarditis/myocarditis, pulmonary embolism, pleurisy, musculoskeletal pain, GERD, less likely ACS  Patient is a 27 year old male with history of hypertension tobacco use who presents because of 1 day of chest pain.  Describes pain as a tight feeling across the center of his chest that initially improved with some antacids yesterday but returned today.  Has been constant since onset today which was when he woke up.  It is pleuritic not exertional without associated nausea or diaphoresis.  Patient's vitals are reassuring no hypoxia or tachycardia.  Patient's  EKG shows early repolarization with some J-point elevation in multiple leads looks similar to prior EKG.  Not consistent with ST elevation MI.  On exam he looks mildly anxious but lungs are clear no signs of DVT no current reproducible chest wall tenderness.  Obtain troponin to screen for ACS and this is less than 2.  Given symptoms started yesterday feel this rules out ACS specially given he is low risk.  The rest of his labs are also reassuring no leukocytosis.  Considered PE but he is PERC negative so feel this is less likely.  Considered pericarditis but I feel that his EKG is more consistent with early repull he has no friction rub and pain is not positional do not feel he meets criteria for this.  Will treat supportively with Tylenol Motrin.  He can follow-up with primary care.  We discussed return precautions.       FINAL CLINICAL IMPRESSION(S) / ED DIAGNOSES   Final diagnoses:  Chest pain, unspecified type     Rx / DC Orders   ED Discharge Orders     None        Note:  This document was prepared using Dragon voice recognition software and may include unintentional dictation errors.   Rada Hay, MD 02/24/22 [phone]

## 2022-05-02 NOTE — Progress Notes (Deleted)
   There were no vitals taken for this visit.   Subjective:    Patient ID: Russell Atkinson, male    DOB: 1995-05-12, 27 y.o.   MRN: [id number]  HPI: Russell Atkinson is a 27 y.o. male  No chief complaint on file.  Establish care: his last physical was ***.  Medical history includes ***.  Family history includes ***.  Health Maintenance ***.   Relevant past medical, surgical, family and social history reviewed and updated as indicated. Interim medical history since our last visit reviewed. Allergies and medications reviewed and updated.  Review of Systems  Constitutional: Negative for fever or weight change.  Respiratory: Negative for cough and shortness of breath.   Cardiovascular: Negative for chest pain or palpitations.  Gastrointestinal: Negative for abdominal pain, no bowel changes.  Musculoskeletal: Negative for gait problem or joint swelling.  Skin: Negative for rash.  Neurological: Negative for dizziness or headache.  No other specific complaints in a complete review of systems (except as listed in HPI above).      Objective:    There were no vitals taken for this visit.  Wt Readings from Last 3 Encounters:  02/24/22 230 lb (104.3 kg)  05/06/20 210 lb (95.3 kg)  08/13/19 220 lb (99.8 kg)    Physical Exam  Constitutional: Patient appears well-developed and well-nourished. Obese *** No distress.  HEENT: head atraumatic, normocephalic, pupils equal and reactive to light, ears ***, neck supple, throat within normal limits Cardiovascular: Normal rate, regular rhythm and normal heart sounds.  No murmur heard. No BLE edema. Pulmonary/Chest: Effort normal and breath sounds normal. No respiratory distress. Abdominal: Soft.  There is no tenderness. Psychiatric: Patient has a normal mood and affect. behavior is normal. Judgment and thought content normal.  Results for orders placed or performed during the hospital encounter of 02/24/22  Basic metabolic  panel  Result Value Ref Range   Sodium 137 135 - 145 mmol/L   Potassium 3.3 (L) 3.5 - 5.1 mmol/L   Chloride 102 98 - 111 mmol/L   CO2 26 22 - 32 mmol/L   Glucose, Bld 83 70 - 99 mg/dL   BUN 14 6 - 20 mg/dL   Creatinine, Ser 4.09 0.61 - 1.24 mg/dL   Calcium 9.1 8.9 - 81.1 mg/dL   GFR, Estimated >91 >47 mL/min   Anion gap 9 5 - 15  CBC  Result Value Ref Range   WBC 10.2 4.0 - 10.5 K/uL   RBC 4.76 4.22 - 5.81 MIL/uL   Hemoglobin 14.6 13.0 - 17.0 g/dL   HCT 82.9 56.2 - 13.0 %   MCV 90.5 80.0 - 100.0 fL   MCH 30.7 26.0 - 34.0 pg   MCHC 33.9 30.0 - 36.0 g/dL   RDW 86.5 78.4 - 69.6 %   Platelets 230 150 - 400 K/uL   nRBC 0.0 0.0 - 0.2 %  Troponin I (High Sensitivity)  Result Value Ref Range   Troponin I (High Sensitivity) <2 <18 ng/L      Assessment & Plan:   Problem List Items Addressed This Visit   None    Follow up plan: No follow-ups on file.

## 2022-05-03 ENCOUNTER — Ambulatory Visit: Payer: Self-pay | Admitting: Nurse Practitioner

## 2022-07-02 ENCOUNTER — Ambulatory Visit: Payer: 59 | Admitting: Nurse Practitioner

## 2022-07-02 NOTE — Progress Notes (Deleted)
   There were no vitals taken for this visit.   Subjective:    Patient ID: Russell Atkinson, male    DOB: 01/11/1995, 27 y.o.   MRN: [id number]  HPI: Russell Atkinson is a 27 y.o. male  No chief complaint on file.  Establish care: his last physical was ***.  Medical history includes ***.  Family history includes ***.  Health Maintenance ***.   Relevant past medical, surgical, family and social history reviewed and updated as indicated. Interim medical history since our last visit reviewed. Allergies and medications reviewed and updated.  Review of Systems  Constitutional: Negative for fever or weight change.  Respiratory: Negative for cough and shortness of breath.   Cardiovascular: Negative for chest pain or palpitations.  Gastrointestinal: Negative for abdominal pain, no bowel changes.  Musculoskeletal: Negative for gait problem or joint swelling.  Skin: Negative for rash.  Neurological: Negative for dizziness or headache.  No other specific complaints in a complete review of systems (except as listed in HPI above).      Objective:    There were no vitals taken for this visit.  Wt Readings from Last 3 Encounters:  02/24/22 230 lb (104.3 kg)  05/06/20 210 lb (95.3 kg)  08/13/19 220 lb (99.8 kg)    Physical Exam  Constitutional: Patient appears well-developed and well-nourished. Obese *** No distress.  HEENT: head atraumatic, normocephalic, pupils equal and reactive to light, ears ***, neck supple, throat within normal limits Cardiovascular: Normal rate, regular rhythm and normal heart sounds.  No murmur heard. No BLE edema. Pulmonary/Chest: Effort normal and breath sounds normal. No respiratory distress. Abdominal: Soft.  There is no tenderness. Psychiatric: Patient has a normal mood and affect. behavior is normal. Judgment and thought content normal.  Results for orders placed or performed during the hospital encounter of 02/24/22  Basic metabolic  panel  Result Value Ref Range   Sodium 137 135 - 145 mmol/L   Potassium 3.3 (L) 3.5 - 5.1 mmol/L   Chloride 102 98 - 111 mmol/L   CO2 26 22 - 32 mmol/L   Glucose, Bld 83 70 - 99 mg/dL   BUN 14 6 - 20 mg/dL   Creatinine, Ser 0.83 0.61 - 1.24 mg/dL   Calcium 9.1 8.9 - 10.3 mg/dL   GFR, Estimated >60 >60 mL/min   Anion gap 9 5 - 15  CBC  Result Value Ref Range   WBC 10.2 4.0 - 10.5 K/uL   RBC 4.76 4.22 - 5.81 MIL/uL   Hemoglobin 14.6 13.0 - 17.0 g/dL   HCT 43.1 39.0 - 52.0 %   MCV 90.5 80.0 - 100.0 fL   MCH 30.7 26.0 - 34.0 pg   MCHC 33.9 30.0 - 36.0 g/dL   RDW 12.8 11.5 - 15.5 %   Platelets 230 150 - 400 K/uL   nRBC 0.0 0.0 - 0.2 %  Troponin I (High Sensitivity)  Result Value Ref Range   Troponin I (High Sensitivity) <2 <18 ng/L      Assessment & Plan:   Problem List Items Addressed This Visit   None    Follow up plan: No follow-ups on file.      

## 2023-02-07 ENCOUNTER — Other Ambulatory Visit: Payer: Self-pay

## 2023-02-07 ENCOUNTER — Emergency Department
Admission: EM | Admit: 2023-02-07 | Discharge: 2023-02-07 | Disposition: A | Discharge status: Home / Self Care | Payer: 59 | Attending: Emergency Medicine | Admitting: Emergency Medicine

## 2023-02-07 DIAGNOSIS — J101 Influenza due to other identified influenza virus with other respiratory manifestations: Secondary | ICD-10-CM | POA: Diagnosis not present

## 2023-02-07 DIAGNOSIS — Z20822 Contact with and (suspected) exposure to covid-19: Secondary | ICD-10-CM | POA: Diagnosis not present

## 2023-02-07 DIAGNOSIS — R059 Cough, unspecified: Secondary | ICD-10-CM | POA: Diagnosis present

## 2023-02-07 LAB — RESP PANEL BY RT-PCR (RSV, FLU A&B, COVID)  RVPGX2
Influenza A by PCR: POSITIVE — AB
Influenza B by PCR: NEGATIVE
Resp Syncytial Virus by PCR: NEGATIVE
SARS Coronavirus 2 by RT PCR: NEGATIVE

## 2023-02-07 MED ORDER — KETOROLAC TROMETHAMINE 60 MG/2ML IM SOLN
30.0000 mg | Freq: Once | INTRAMUSCULAR | Status: AC
  Administered 2023-02-07: 30 mg via INTRAMUSCULAR
  Filled 2023-02-07: qty 2

## 2023-02-07 NOTE — ED Provider Notes (Signed)
 [facility] Provider Note    Event Date/Time   First MD Initiated Contact with Patient 02/07/23 2014     (approximate)   History   Chest Pain   HPI Russell Atkinson is a 28 y.o. male presenting today for cough, chest congestion, and fever.  Patient states he has had symptoms for the past 4 days.  Multiple sick contacts at work as well.  Pain in the chest when he coughs but none when he is taking a deep breath or at rest.  Otherwise denies nausea, vomiting, abdominal pain, diarrhea.     Physical Exam   Triage Vital Signs: ED Triage Vitals [02/07/23 1837]  Encounter Vitals Group     BP (!) 159/93     Systolic BP Percentile      Diastolic BP Percentile      Pulse Rate 80     Resp 20     Temp 99.1 F (37.3 C)     Temp Source Oral     SpO2 100 %     Weight      Height      Head Circumference      Peak Flow      Pain Score 5     Pain Loc      Pain Education      Exclude from Growth Chart     Most recent vital signs: Vitals:   02/07/23 1837  BP: (!) 159/93  Pulse: 80  Resp: 20  Temp: 99.1 F (37.3 C)  SpO2: 100%   Physical Exam: I have reviewed the vital signs and nursing notes. General: Awake, alert, no acute distress.  Nontoxic appearing. Head:  Atraumatic, normocephalic.   ENT:  EOM intact, PERRL. Oral mucosa is pink and moist with no lesions. Neck: Neck is supple with full range of motion, No meningeal signs. Cardiovascular:  RRR, No murmurs. Peripheral pulses palpable and equal bilaterally. Respiratory:  Symmetrical chest wall expansion.  No rhonchi, rales, or wheezes.  Good air movement throughout.  No use of accessory muscles.   Musculoskeletal:  No cyanosis or edema. Moving extremities with full ROM Abdomen:  Soft, nontender, nondistended. Neuro:  GCS 15, moving all four extremities, interacting appropriately. Speech clear. Psych:  Calm, appropriate.   Skin:  Warm, dry, no rash.    ED Results / Procedures /  Treatments   Labs (all labs ordered are listed, but only abnormal results are displayed) Labs Reviewed  RESP PANEL BY RT-PCR (RSV, FLU A&B, COVID)  [id number] - Abnormal; Notable for the following components:      Result Value   Influenza A by PCR POSITIVE (*)    All other components within normal limits     EKG My EKG interpretation: Rate of 77, normal sinus rhythm, normal axis, normal intervals.  No acute ST elevations or depressions   RADIOLOGY    PROCEDURES:  Critical Care performed: No  Procedures   MEDICATIONS ORDERED IN ED: Medications  ketorolac  (TORADOL ) injection 30 mg (30 mg Intramuscular Given 02/07/23 2113)     IMPRESSION / MDM / ASSESSMENT AND PLAN / ED COURSE  I reviewed the triage vital signs and the nursing notes.                              Differential diagnosis includes, but is not limited to, COVID, flu, RSV  Patient's presentation is most consistent with acute complicated illness / injury  requiring diagnostic workup.  Patient is a 28 year old male presenting today for cough and congestion.  Vital signs are stable and physical exam largely unremarkable.  Chest pain is producible with a cough and started after prolonged period of coughing and suspect more musculoskeletal related.  Patient ultimately did test positive for flu which is the source of his symptoms today.  EKG reassuring.  Patient given Toradol  shot for symptomatic management and safe for discharge at this time with strict return precautions.     FINAL CLINICAL IMPRESSION(S) / ED DIAGNOSES   Final diagnoses:  Influenza A     Rx / DC Orders   ED Discharge Orders     None        Note:  This document was prepared using Dragon voice recognition software and may include unintentional dictation errors.   Malvina Alm DASEN, MD 02/07/23 2115

## 2023-02-07 NOTE — Discharge Instructions (Signed)
 You can take ibuprofen  600 mg every 8 hours scheduled for the next 3 to 4 days to help with pain symptoms from your cough.  Maintain adequate hydration to help you get over the flu.

## 2023-02-07 NOTE — ED Triage Notes (Signed)
 Pt to ED via POV from home. Pt reports centralized CP with coughing. Pt reports fever of 101 at home. Pt reports sick contacts at work.
